# Patient Record
Sex: Female | Born: 1975 | Race: White | Hispanic: No | Marital: Married | Smoking: Never smoker
Health system: Southern US, Community
[De-identification: ages and names within clinical notes are randomized; demographics above are authoritative.]

## PROBLEM LIST (undated history)

## (undated) DIAGNOSIS — I4891 Unspecified atrial fibrillation: Secondary | ICD-10-CM

## (undated) HISTORY — DX: Unspecified atrial fibrillation: I48.91

## (undated) HISTORY — PX: LEEP: SHX91

---

## 1999-09-30 ENCOUNTER — Emergency Department (HOSPITAL_COMMUNITY): Admission: EM | Admit: 1999-09-30 | Discharge: 1999-09-30 | Payer: Self-pay | Admitting: Emergency Medicine

## 1999-09-30 ENCOUNTER — Encounter: Payer: Self-pay | Admitting: Emergency Medicine

## 1999-12-04 ENCOUNTER — Other Ambulatory Visit: Admission: RE | Admit: 1999-12-04 | Discharge: 1999-12-04 | Payer: Self-pay | Admitting: Obstetrics and Gynecology

## 2000-12-28 ENCOUNTER — Other Ambulatory Visit: Admission: RE | Admit: 2000-12-28 | Discharge: 2000-12-28 | Payer: Self-pay | Admitting: Obstetrics and Gynecology

## 2001-05-06 ENCOUNTER — Encounter: Admission: RE | Admit: 2001-05-06 | Discharge: 2001-05-06 | Payer: Self-pay | Admitting: Internal Medicine

## 2001-05-06 ENCOUNTER — Encounter: Payer: Self-pay | Admitting: Internal Medicine

## 2001-05-19 ENCOUNTER — Encounter: Payer: Self-pay | Admitting: Gastroenterology

## 2001-05-19 ENCOUNTER — Emergency Department (HOSPITAL_COMMUNITY): Admission: EM | Admit: 2001-05-19 | Discharge: 2001-05-19 | Payer: Self-pay | Admitting: *Deleted

## 2001-05-20 ENCOUNTER — Encounter: Payer: Self-pay | Admitting: Gastroenterology

## 2001-05-20 ENCOUNTER — Ambulatory Visit (HOSPITAL_COMMUNITY): Admission: RE | Admit: 2001-05-20 | Discharge: 2001-05-20 | Payer: Self-pay | Admitting: Gastroenterology

## 2001-05-27 ENCOUNTER — Ambulatory Visit (HOSPITAL_COMMUNITY): Admission: RE | Admit: 2001-05-27 | Discharge: 2001-05-27 | Payer: Self-pay | Admitting: Gastroenterology

## 2001-12-19 ENCOUNTER — Other Ambulatory Visit: Admission: RE | Admit: 2001-12-19 | Discharge: 2001-12-19 | Payer: Self-pay | Admitting: Obstetrics and Gynecology

## 2002-06-13 ENCOUNTER — Inpatient Hospital Stay (HOSPITAL_COMMUNITY): Admission: AD | Admit: 2002-06-13 | Discharge: 2002-06-16 | Payer: Self-pay | Admitting: Obstetrics and Gynecology

## 2002-07-25 ENCOUNTER — Other Ambulatory Visit: Admission: RE | Admit: 2002-07-25 | Discharge: 2002-07-25 | Payer: Self-pay | Admitting: Obstetrics and Gynecology

## 2002-09-05 ENCOUNTER — Encounter: Admission: RE | Admit: 2002-09-05 | Discharge: 2002-10-05 | Payer: Self-pay | Admitting: Obstetrics and Gynecology

## 2003-01-15 ENCOUNTER — Encounter: Admission: RE | Admit: 2003-01-15 | Discharge: 2003-01-15 | Payer: Self-pay | Admitting: Internal Medicine

## 2003-08-31 ENCOUNTER — Other Ambulatory Visit: Admission: RE | Admit: 2003-08-31 | Discharge: 2003-08-31 | Payer: Self-pay | Admitting: Obstetrics and Gynecology

## 2003-11-20 ENCOUNTER — Other Ambulatory Visit: Admission: RE | Admit: 2003-11-20 | Discharge: 2003-11-20 | Payer: Self-pay | Admitting: Obstetrics and Gynecology

## 2004-05-01 ENCOUNTER — Other Ambulatory Visit: Admission: RE | Admit: 2004-05-01 | Discharge: 2004-05-01 | Payer: Self-pay | Admitting: Obstetrics and Gynecology

## 2004-09-19 ENCOUNTER — Other Ambulatory Visit: Admission: RE | Admit: 2004-09-19 | Discharge: 2004-09-19 | Payer: Self-pay | Admitting: Obstetrics and Gynecology

## 2004-10-06 ENCOUNTER — Encounter: Admission: RE | Admit: 2004-10-06 | Discharge: 2004-10-06 | Payer: Self-pay | Admitting: Obstetrics and Gynecology

## 2005-05-05 ENCOUNTER — Ambulatory Visit (HOSPITAL_COMMUNITY): Admission: RE | Admit: 2005-05-05 | Discharge: 2005-05-05 | Payer: Self-pay | Admitting: Internal Medicine

## 2006-02-16 ENCOUNTER — Emergency Department (HOSPITAL_COMMUNITY): Admission: EM | Admit: 2006-02-16 | Discharge: 2006-02-17 | Payer: Self-pay | Admitting: Emergency Medicine

## 2006-03-01 ENCOUNTER — Encounter: Admission: RE | Admit: 2006-03-01 | Discharge: 2006-03-01 | Payer: Self-pay | Admitting: Optometry

## 2006-03-25 ENCOUNTER — Emergency Department (HOSPITAL_COMMUNITY): Admission: EM | Admit: 2006-03-25 | Discharge: 2006-03-25 | Payer: Self-pay | Admitting: Emergency Medicine

## 2008-07-17 IMAGING — CT CT ORBIT/TEMPORAL/IAC W/O CM
3 of 4 series · 16 of 47 positions shown, 19 images · IV contrast (agent unspecified)
Comparison: 01/15/03.

CLINICAL DATA: History of left eye trauma three months ago.  Left medial orbital and sinus pain.  
 CT ORBIT WITHOUT CONTRAST:
TECHNIQUE: Axial and coronal plane CT imaging was performed through the orbits.  No intravenous contrast was administered.

[Series 3: bone window sinus · axial · 0.33mm/px · z∈[-38,+42]mm · 10 of 38 slices shown, 13 images]
[im 3/38  brain]
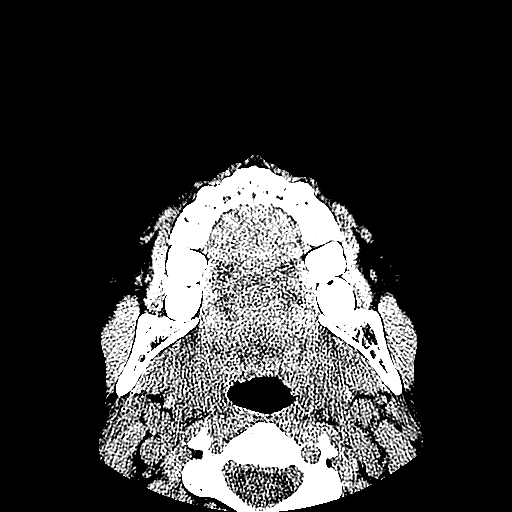
[im 3/38  bone]
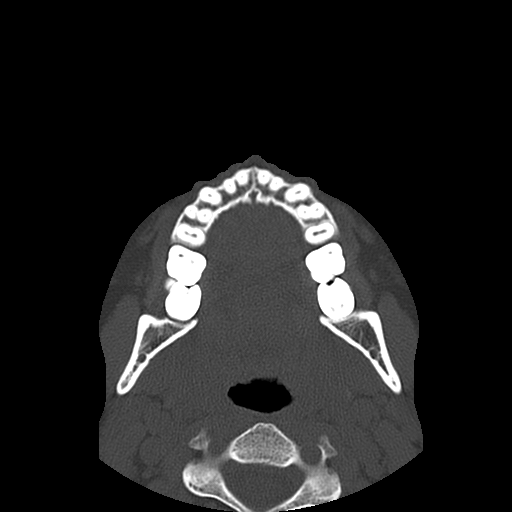
[im 6/38  bone]
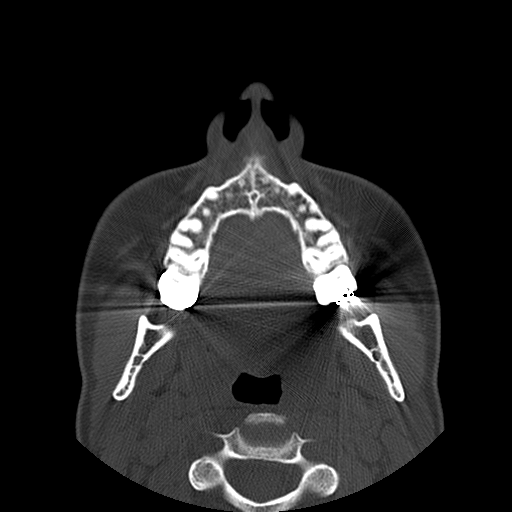
[im 11/38  bone]
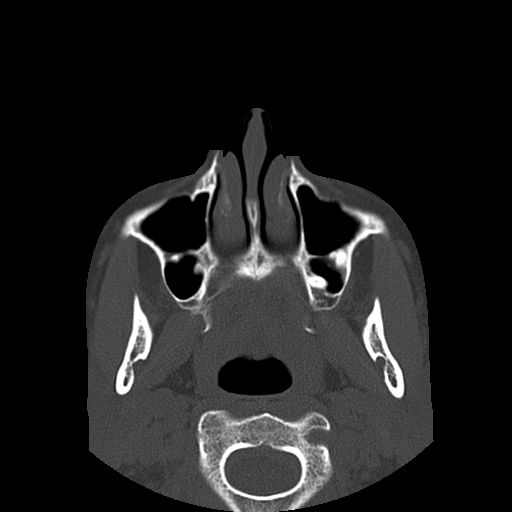
[im 14/38  bone]
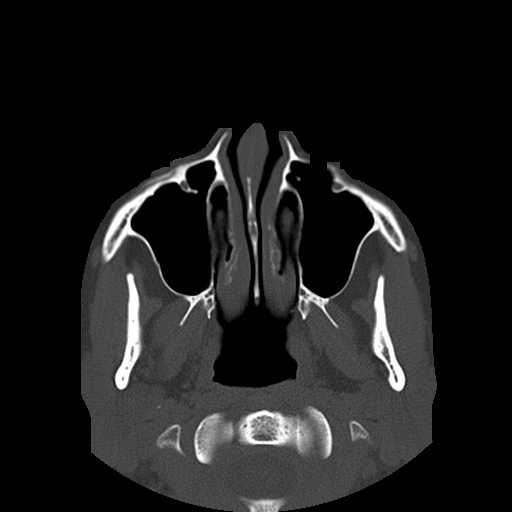
[im 16/38  brain]
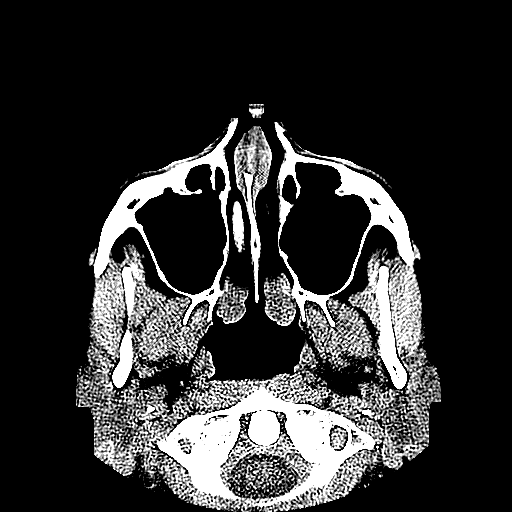
[im 16/38  bone]
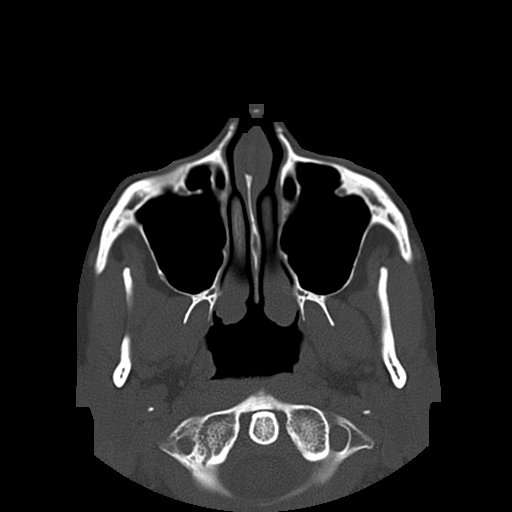
[im 22/38  bone]
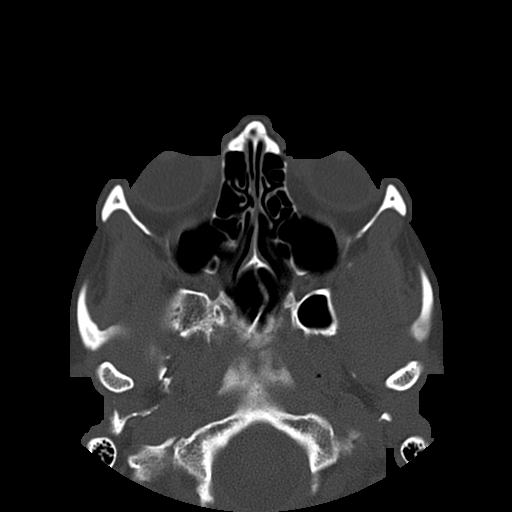
[im 24/38  bone]
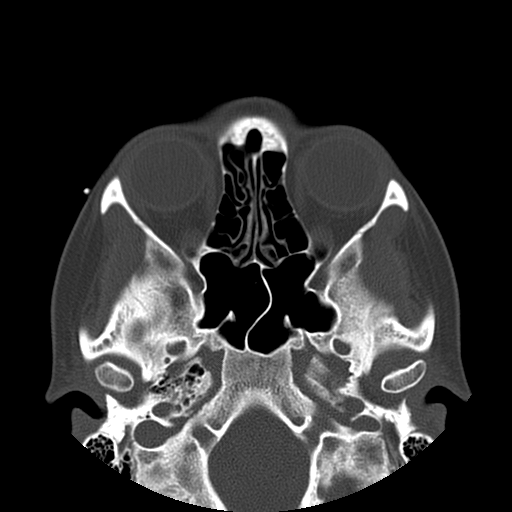
[im 27/38  bone]
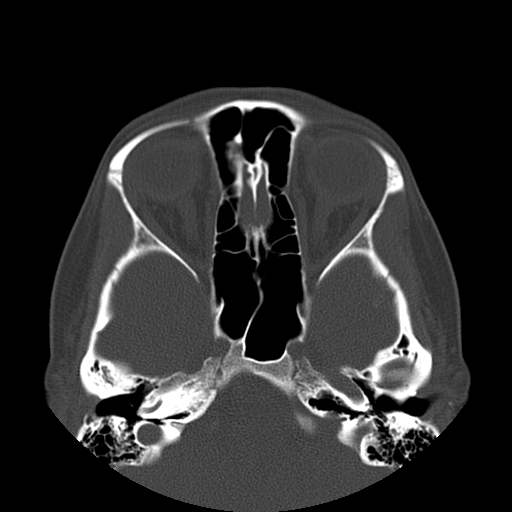
[im 32/38  brain]
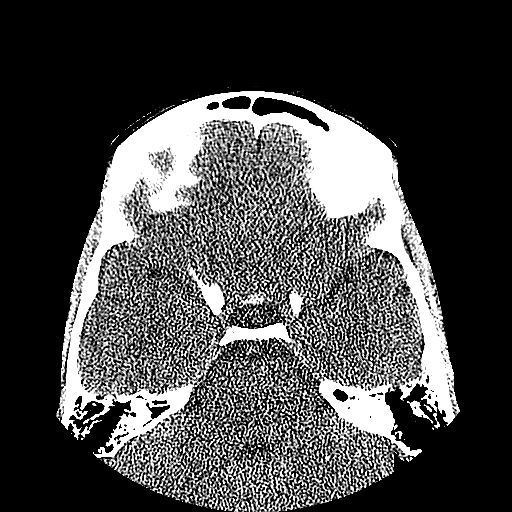
[im 32/38  bone]
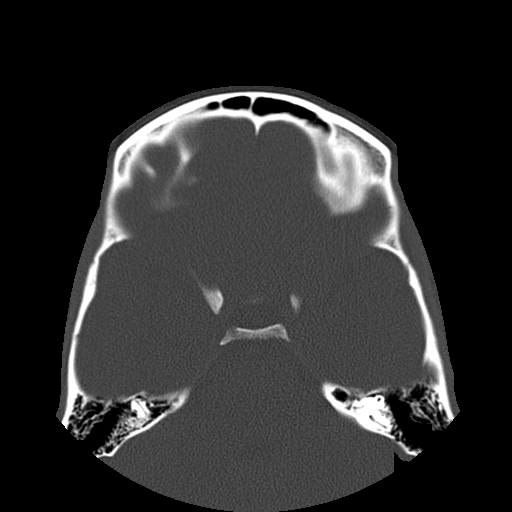
[im 35/38  bone]
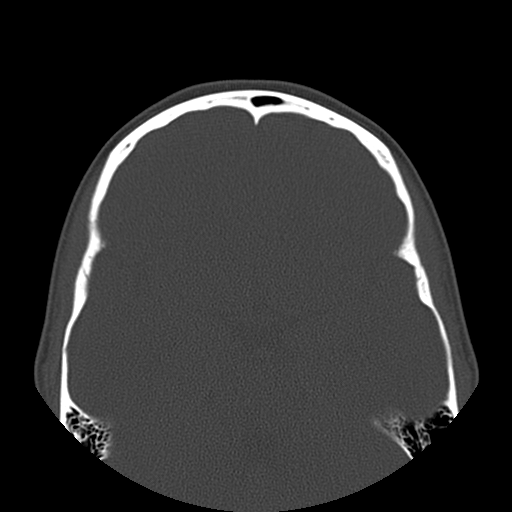

[Series 601: coronal body · coronal · 0.33mm/px · 3 of 49 slices shown]
[im 17/49  bone]
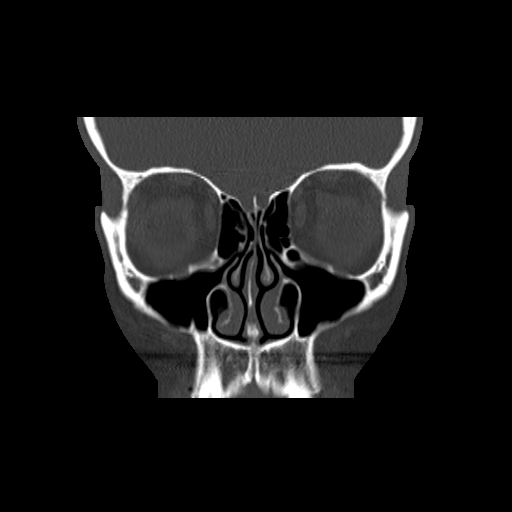
[im 25/49  bone]
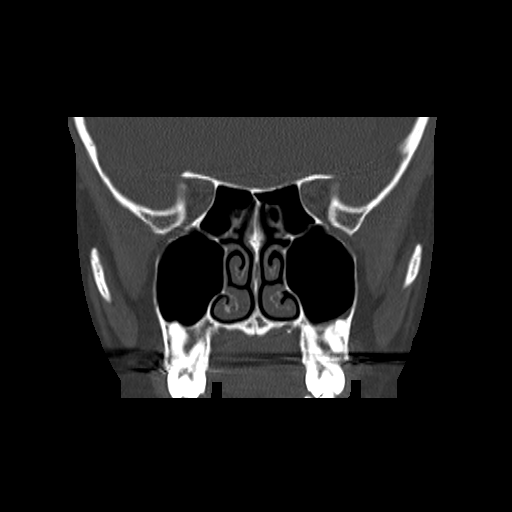
[im 33/49  bone]
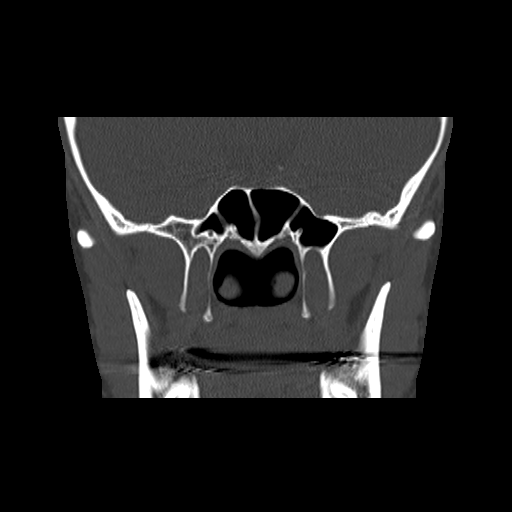

[Series 602: sagittal body · sagittal · 0.33mm/px · 3 of 47 slices shown]
[im 16/47  bone]
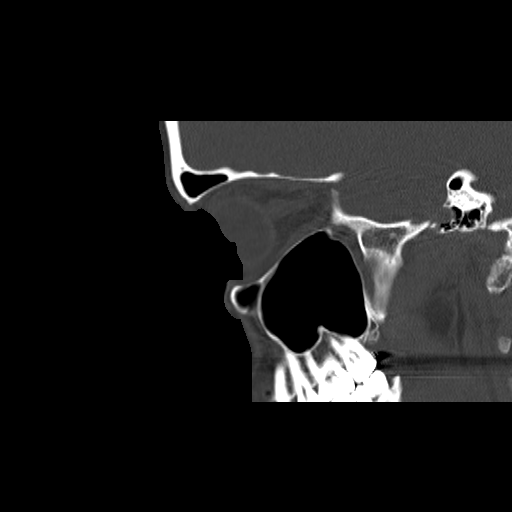
[im 21/47  bone]
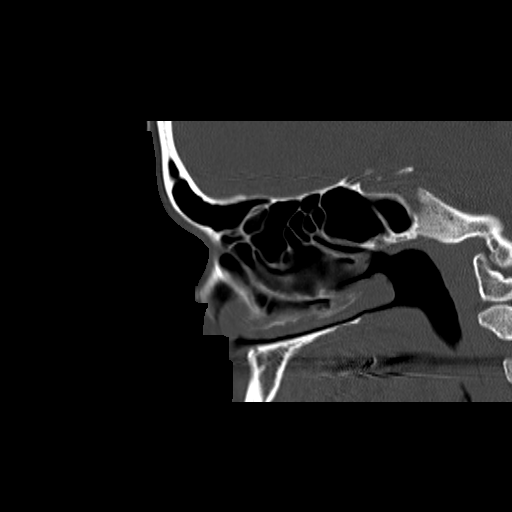
[im 26/47  bone]
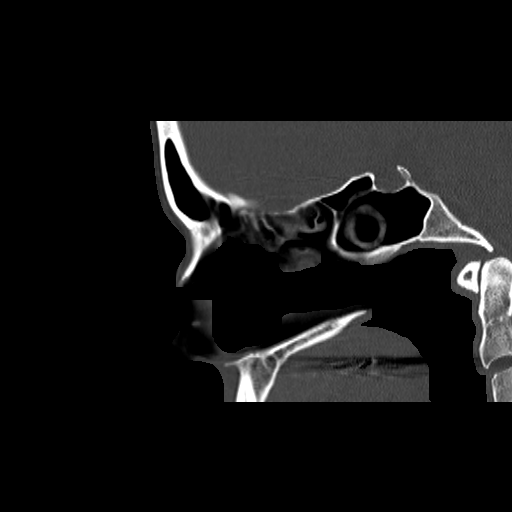

[16 of 47 positions shown; findings below may reference images not displayed]

FINDINGS: Soft tissue windows demonstrate normal appearance of the globes and extraocular muscles.  BB over the right temporal region.  No significant soft tissue swelling.  Limited intracranial imaging is normal.  
 Bone windows demonstrate clear paranasal sinuses.  Clear mastoid air cells.  No fracture.  The orbital walls are intact.  Mandibular condyles are located.  
 Orbital floors are intact.
IMPRESSION: No acute findings.

## 2019-06-09 ENCOUNTER — Encounter: Payer: Self-pay | Admitting: Cardiology

## 2019-06-09 ENCOUNTER — Ambulatory Visit (INDEPENDENT_AMBULATORY_CARE_PROVIDER_SITE_OTHER): Payer: Medicaid Other | Admitting: Cardiology

## 2019-06-09 ENCOUNTER — Other Ambulatory Visit: Payer: Self-pay

## 2019-06-09 VITALS — BP 102/64 | HR 80 | Temp 98.2°F | Ht 66.5 in | Wt 120.6 lb

## 2019-06-09 DIAGNOSIS — R0609 Other forms of dyspnea: Secondary | ICD-10-CM

## 2019-06-09 DIAGNOSIS — R002 Palpitations: Secondary | ICD-10-CM

## 2019-06-09 DIAGNOSIS — R0600 Dyspnea, unspecified: Secondary | ICD-10-CM | POA: Diagnosis not present

## 2019-06-09 DIAGNOSIS — I480 Paroxysmal atrial fibrillation: Secondary | ICD-10-CM

## 2019-06-09 DIAGNOSIS — R5383 Other fatigue: Secondary | ICD-10-CM

## 2019-06-09 HISTORY — DX: Paroxysmal atrial fibrillation: I48.0

## 2019-06-09 HISTORY — DX: Other forms of dyspnea: R06.09

## 2019-06-09 HISTORY — DX: Other fatigue: R53.83

## 2019-06-09 HISTORY — DX: Dyspnea, unspecified: R06.00

## 2019-06-09 HISTORY — DX: Palpitations: R00.2

## 2019-06-09 NOTE — Progress Notes (Signed)
Cardiology Consultation:    Date:  06/09/2019   ID:  Anita Rasmussen, DOB [birth year], MRN [id number]  PCP:  Arnetha Gula, MD  Cardiologist:  Jenne Campus, MD   Referring MD: Arnetha Gula, MD   No chief complaint on file. Palpitations  History of Present Illness:    Anita Rasmussen is a 44 y.o. female who is being seen today for the evaluation of palpitations at the request of Arnetha Gula, MD. About 10 years ago she was given some Levaquin, she did have some allergic reaction to it didn't feel well and since that time all problem started. The reason why she is in the office is the fact that she does have palpitations. She can feel her heart skipping a beat that happened on 2 different situations. Usually when she is resting. She also complained of having some more sustained arrhythmias when she can feel her heart speeding up. Usually gradual onset gradual offset. She is able to slow her heart down by calming herself down. I do have her referring physician notes that tells that she does have history of paroxysmal atrial fibrillation, however, I have no documentation for it. She cannot tell me if her heart is regular or irregular when she has palpitations. She also complained of having a lot of additional problems that include profound weakness fatigue as well as shortness of breath. She used to exercise on the regular basis she said she used to lift some weights. However now she cannot do it because of profound weakness and fatigue. She was told to be allergic to multiple foods, and she is very special diet to try to limit this problem. He does not have a primary care physician also seems on documentation of her borderline diabetes as well as some vitamin D deficiency. She does not exercise on a regular basis Does not have hypertension She is not sure what her cholesterol is.   Past Medical History:  Diagnosis Date  . Atrial fibrillation [facility])     Past Surgical  History:  Procedure Laterality Date  . LEEP      Current Medications: No outpatient medications have been marked as taking for the 06/09/19 encounter (Office Visit) with Park Liter, MD.     Allergies:   Quinolones   Social History   Socioeconomic History  . Marital status: Married    Spouse name: Not on file  . Number of children: Not on file  . Years of education: Not on file  . Highest education level: Not on file  Occupational History  . Not on file  Tobacco Use  . Smoking status: Never Smoker  . Smokeless tobacco: Never Used  Substance and Sexual Activity  . Alcohol use: Not Currently  . Drug use: Not on file  . Sexual activity: Not on file  Other Topics Concern  . Not on file  Social History Narrative  . Not on file   Social Determinants of Health   Financial Resource Strain:   . Difficulty of Paying Living Expenses:   Food Insecurity:   . Worried About Charity fundraiser in the Last Year:   . Arboriculturist in the Last Year:   Transportation Needs:   . Film/video editor (Medical):   Marland Kitchen Lack of Transportation (Non-Medical):   Physical Activity:   . Days of Exercise per Week:   . Minutes of Exercise per Session:   Stress:   . Feeling of Stress :  Social Connections:   . Frequency of Communication with Friends and Family:   . Frequency of Social Gatherings with Friends and Family:   . Attends Religious Services:   . Active Member of Clubs or Organizations:   . Attends Banker Meetings:   Marland Kitchen Marital Status:      Family History: The patient's family history includes Cancer in her maternal grandmother; Diabetes in her paternal grandmother; Heart attack in her maternal grandfather and paternal grandfather; Scoliosis in her father. ROS:   Please see the history of present illness.    All 14 point review of systems negative except as described per history of present illness.  EKGs/Labs/Other Studies Reviewed:    The following  studies were reviewed today:   EKG:  EKG is  ordered today.  The ekg ordered today demonstrates normal sinus rhythm, normal P interval, normal QS complex duration pathology, no ST segment changes.  Recent Labs: No results found for requested labs within last 8760 hours.  Recent Lipid Panel No results found for: CHOL, TRIG, HDL, CHOLHDL, VLDL, LDLCALC, LDLDIRECT  Physical Exam:    VS:  BP 102/64   Pulse 80   Temp 98.2 F (36.8 C)   Ht 5' 6.5" (1.689 m)   Wt 120 lb 9.6 oz (54.7 kg)   SpO2 97%   BMI 19.17 kg/m     Wt Readings from Last 3 Encounters:  06/09/19 120 lb 9.6 oz (54.7 kg)     GEN:  Well nourished, well developed in no acute distress HEENT: Normal NECK: No JVD; No carotid bruits LYMPHATICS: No lymphadenopathy CARDIAC: RRR, no murmurs, no rubs, no gallops RESPIRATORY:  Clear to auscultation without rales, wheezing or rhonchi  ABDOMEN: Soft, non-tender, non-distended MUSCULOSKELETAL:  No edema; No deformity  SKIN: Warm and dry NEUROLOGIC:  Alert and oriented x 3 PSYCHIATRIC:  Normal affect   ASSESSMENT:    1. Dyspnea on exertion   2. Palpitations   3. Fatigue, unspecified type   4. Paroxysmal atrial fibrillation (HCC)    PLAN:    In order of problems listed above:  1. Palpitations with some diagnosis of paroxysmal atrial fibrillation. We talked in length about this condition. From the description she gave me she clearly got some extrasystole, however concern is more sustained arrhythmia. I offered her Zio patch. She told me that she does not have any insurance and she is looking for an alternative to it. Therefore, we talk about potentially using Cardia or Apple watch.  She is willing to try that.  However because of her palpitation as well as dyspnea on exertion and potential history of proximal atrial fibrillation I will ask her to have an echocardiogram.  She agreed to have it. 2. Questionable history of paroxysmal atrial fibrillation.  I have no  documentation for it I see some documentation of some palpitations that being coming down with some drinking of water usually this is not an elevated atrial fibrillation behavior.  Again we will try to get some monitor to determine exactly what kind of arrhythmia with dealing with. 3. Multiple additional problems.  Followed by internal medicine team.  I think the key for her will be to start being a little more active and hopefully with monitor showing no significant arrhythmia and echocardiogram showing preserved ejection fraction will be able to do that.     Medication Adjustments/Labs and Tests Ordered: Current medicines are reviewed at length with the patient today.  Concerns regarding medicines are outlined above.  Orders Placed This Encounter  Procedures  . EKG 12-Lead  . ECHOCARDIOGRAM COMPLETE   No orders of the defined types were placed in this encounter.   Signed, Georgeanna Lea, MD, [facility]. 06/09/2019 12:14 PM    [facility]

## 2019-06-09 NOTE — Patient Instructions (Signed)
Medication Instructions:  Your physician recommends that you continue on your current medications as directed. Please refer to the Current Medication list given to you today.  *If you need a refill on your cardiac medications before your next appointment, please call your pharmacy*   Lab Work: None If you have labs (blood work) drawn today and your tests are completely normal, you will receive your results only by: . MyChart Message (if you have MyChart) OR . A paper copy in the mail If you have any lab test that is abnormal or we need to change your treatment, we will call you to review the results.   Testing/Procedures: Your physician has requested that you have an echocardiogram. Echocardiography is a painless test that uses sound waves to create images of your heart. It provides your doctor with information about the size and shape of your heart and how well your heart's chambers and valves are working. This procedure takes approximately one hour. There are no restrictions for this procedure.     Follow-Up: At [facility], you and your health needs are our priority.  As part of our continuing mission to provide you with exceptional heart care, we have created designated Provider Care Teams.  These Care Teams include your primary Cardiologist (physician) and Advanced Practice Providers (APPs -  Physician Assistants and Nurse Practitioners) who all work together to provide you with the care you need, when you need it.  We recommend signing up for the patient portal called "MyChart".  Sign up information is provided on this After Visit Summary.  MyChart is used to connect with patients for Virtual Visits (Telemedicine).  Patients are able to view lab/test results, encounter notes, upcoming appointments, etc.  Non-urgent messages can be sent to your provider as well.   To learn more about what you can do with MyChart, go to [email or web address].    Your next appointment:   6  week(s)  The format for your next appointment:   In Person  Provider:   Robert Krasowski, MD   Other Instructions   

## 2019-06-22 ENCOUNTER — Ambulatory Visit (INDEPENDENT_AMBULATORY_CARE_PROVIDER_SITE_OTHER): Payer: Medicaid Other

## 2019-06-22 ENCOUNTER — Other Ambulatory Visit: Payer: Self-pay

## 2019-06-22 DIAGNOSIS — R002 Palpitations: Secondary | ICD-10-CM | POA: Diagnosis not present

## 2019-06-22 DIAGNOSIS — R0600 Dyspnea, unspecified: Secondary | ICD-10-CM

## 2019-06-22 NOTE — Progress Notes (Signed)
Complete echocardiogram performed.  Jimmy Clora Ohmer RDCS, RVT  

## 2019-06-26 ENCOUNTER — Telehealth: Payer: Self-pay

## 2019-06-26 NOTE — Telephone Encounter (Signed)
-----   Message from Georgeanna Lea, MD sent at 06/25/2019  6:50 PM EDT ----- Echocardiogram showed normal left ventricle ejection fraction, overall looks good

## 2019-06-26 NOTE — Telephone Encounter (Signed)
Spoke with patient regarding results.  Patient verbalizes understanding and is agreeable to plan of care. Advised patient to call back with any issues or concerns.  

## 2019-07-05 ENCOUNTER — Other Ambulatory Visit: Payer: Self-pay

## 2019-07-05 ENCOUNTER — Ambulatory Visit (INDEPENDENT_AMBULATORY_CARE_PROVIDER_SITE_OTHER): Payer: Medicaid Other | Admitting: Cardiology

## 2019-07-05 ENCOUNTER — Encounter: Payer: Self-pay | Admitting: Cardiology

## 2019-07-05 ENCOUNTER — Ambulatory Visit (INDEPENDENT_AMBULATORY_CARE_PROVIDER_SITE_OTHER): Payer: Medicaid Other

## 2019-07-05 VITALS — BP 94/62 | HR 71 | Ht 66.5 in | Wt 120.0 lb

## 2019-07-05 DIAGNOSIS — R5383 Other fatigue: Secondary | ICD-10-CM | POA: Diagnosis not present

## 2019-07-05 DIAGNOSIS — R002 Palpitations: Secondary | ICD-10-CM

## 2019-07-05 DIAGNOSIS — I480 Paroxysmal atrial fibrillation: Secondary | ICD-10-CM

## 2019-07-05 NOTE — Progress Notes (Signed)
Cardiology Office Note:    Date:  07/05/2019   ID:  Anita Rasmussen, DOB [birth year], MRN [id number]  PCP:  Arnetha Gula, MD  Cardiologist:  Berniece Salines, DO  Electrophysiologist:  None   Referring MD: Arnetha Gula, MD   Chief Complaint  Patient presents with  . Establish Care    Follow up echo   History of Present Illness:    Anita Rasmussen is a 44 y.o. female with a hx of paroxysmal atrial fibrillation which was diagnosed about 12 years ago, she has been told that she has autoimmune disease unclear which, vitamin D deficiency,. She states that this all started when was given Levaquin.  At that time she was advised to be started on medication but she was told that it would be at a minimal dose therefore she did not want to be started on this medication.  Tells me that she does have intermittent palpitations. She reports that she has had medications recently for diagnosed EBV which made her palpitations worse.  But since stopping the medication this has resolved.  Of note she did see my partner on June 09, 2019 at that time an echocardiogram was done the results were previously called to the patient which was noted to be normal.  Past Medical History:  Diagnosis Date  . Atrial fibrillation [facility])     Past Surgical History:  Procedure Laterality Date  . LEEP      Current Medications: No outpatient medications have been marked as taking for the 07/05/19 encounter (Office Visit) with Berniece Salines, DO.     Allergies:   Quinolones   Social History   Socioeconomic History  . Marital status: Married    Spouse name: Not on file  . Number of children: Not on file  . Years of education: Not on file  . Highest education level: Not on file  Occupational History  . Not on file  Tobacco Use  . Smoking status: Never Smoker  . Smokeless tobacco: Never Used  Substance and Sexual Activity  . Alcohol use: Not Currently  . Drug use: Not on file  . Sexual activity: Not on  file  Other Topics Concern  . Not on file  Social History Narrative  . Not on file   Social Determinants of Health   Financial Resource Strain:   . Difficulty of Paying Living Expenses:   Food Insecurity:   . Worried About Charity fundraiser in the Last Year:   . Arboriculturist in the Last Year:   Transportation Needs:   . Film/video editor (Medical):   Marland Kitchen Lack of Transportation (Non-Medical):   Physical Activity:   . Days of Exercise per Week:   . Minutes of Exercise per Session:   Stress:   . Feeling of Stress :   Social Connections:   . Frequency of Communication with Friends and Family:   . Frequency of Social Gatherings with Friends and Family:   . Attends Religious Services:   . Active Member of Clubs or Organizations:   . Attends Archivist Meetings:   Marland Kitchen Marital Status:      Family History: The patient's family history includes Cancer in her maternal grandmother; Diabetes in her paternal grandmother; Heart attack in her maternal grandfather and paternal grandfather; Scoliosis in her father.  ROS:   Review of Systems  Constitution: Negative for decreased appetite, fever and weight gain.  HENT: Negative for congestion, ear discharge, hoarse voice  and sore throat.   Eyes: Negative for discharge, redness, vision loss in right eye and visual halos.  Cardiovascular: Negative for chest pain, dyspnea on exertion, leg swelling, orthopnea and palpitations.  Respiratory: Negative for cough, hemoptysis, shortness of breath and snoring.   Endocrine: Negative for heat intolerance and polyphagia.  Hematologic/Lymphatic: Negative for bleeding problem. Does not bruise/bleed easily.  Skin: Negative for flushing, nail changes, rash and suspicious lesions.  Musculoskeletal: Negative for arthritis, joint pain, muscle cramps, myalgias, neck pain and stiffness.  Gastrointestinal: Negative for abdominal pain, bowel incontinence, diarrhea and excessive appetite.   Genitourinary: Negative for decreased libido, genital sores and incomplete emptying.  Neurological: Negative for brief paralysis, focal weakness, headaches and loss of balance.  Psychiatric/Behavioral: Negative for altered mental status, depression and suicidal ideas.  Allergic/Immunologic: Negative for HIV exposure and persistent infections.    EKGs/Labs/Other Studies Reviewed:    The following studies were reviewed today:   EKG:  The ekg ordered today demonstrates    TTE IMPRESSIONS 4/222021 1. Left ventricular ejection fraction, by estimation, is 60 to 65%. The left ventricle has normal function. The left ventricle has no regional wall motion abnormalities. Left ventricular diastolic parameters are consistent with Grade I diastolic dysfunction (impaired relaxation).  2. Right ventricular systolic function is normal. The right ventricular size is normal. There is normal pulmonary artery systolic pressure. 3. The mitral valve is normal in structure. No evidence of mitral valve regurgitation. No evidence of mitral stenosis.  4. The aortic valve is normal in structure. Aortic valve regurgitation is not visualized. No aortic stenosis is present.  5. The inferior vena cava is normal in size with greater than 50% respiratory variability, suggesting right atrial pressure of 3 mmHg.   Recent Labs: No results found for requested labs within last 8760 hours.  Recent Lipid Panel No results found for: CHOL, TRIG, HDL, CHOLHDL, VLDL, LDLCALC, LDLDIRECT  Physical Exam:    VS:  BP 94/62 (BP Location: Left Arm, Patient Position: Sitting, Cuff Size: Normal)   Pulse 71   Ht 5' 6.5" (1.689 m)   Wt 120 lb (54.4 kg)   SpO2 95%   BMI 19.08 kg/m     Wt Readings from Last 3 Encounters:  07/05/19 120 lb (54.4 kg)  06/09/19 120 lb 9.6 oz (54.7 kg)     GEN: Well nourished, well developed in no acute distress HEENT: Normal NECK: No JVD; No carotid bruits LYMPHATICS: No  lymphadenopathy CARDIAC: S1S2 noted,RRR, no murmurs, rubs, gallops RESPIRATORY:  Clear to auscultation without rales, wheezing or rhonchi  ABDOMEN: Soft, non-tender, non-distended, +bowel sounds, no guarding. EXTREMITIES: No edema, No cyanosis, no clubbing MUSCULOSKELETAL:  No deformity  SKIN: Warm and dry NEUROLOGIC:  Alert and oriented x 3, non-focal PSYCHIATRIC:  Normal affect, good insight  ASSESSMENT:    1. PAF (paroxysmal atrial fibrillation) (HCC)   2. Palpitations   3. Fatigue, unspecified type    PLAN:     Today in the office she is willing to try the monitor.  I have educated the patient how to use this device.  I am hoping will be able to get more information that she wears her ZIO monitor.  Her echo result again was discussed with me in the office.  She had no questions at this time.   The patient is in agreement with the above plan. The patient left the office in stable condition.  The patient will follow up in 3 months or sooner if needed.   Medication  Adjustments/Labs and Tests Ordered: Current medicines are reviewed at length with the patient today.  Concerns regarding medicines are outlined above.  Orders Placed This Encounter  Procedures  . LONG TERM MONITOR (3-14 DAYS)   No orders of the defined types were placed in this encounter.   Patient Instructions  Medication Instructions:  Your physician recommends that you continue on your current medications as directed. Please refer to the Current Medication list given to you today.   *If you need a refill on your cardiac medications before your next appointment, please call your pharmacy*   Lab Work: None ordered   If you have labs (blood work) drawn today and your tests are completely normal, you will receive your results only by: Marland Kitchen MyChart Message (if you have MyChart) OR . A paper copy in the mail If you have any lab test that is abnormal or we need to change your treatment, we will call you to  review the results.   Testing/Procedures: A zio monitor was ordered today. It will remain on for 14 days. You will then return monitor and event diary in provided box. It takes 1-2 weeks for report to be downloaded and returned to [place]. We will call you with the results. If monitor falls off or has orange flashing light, please call Zio for further instructions.      Follow-Up: At [facility], you and your health needs are our priority.  As part of our continuing mission to provide you with exceptional heart care, we have created designated Provider Care Teams.  These Care Teams include your primary Cardiologist (physician) and Advanced Practice Providers (APPs -  Physician Assistants and Nurse Practitioners) who all work together to provide you with the care you need, when you need it.  We recommend signing up for the patient portal called "MyChart".  Sign up information is provided on this After Visit Summary.  MyChart is used to connect with patients for Virtual Visits (Telemedicine).  Patients are able to view lab/test results, encounter notes, upcoming appointments, etc.  Non-urgent messages can be sent to your provider as well.   To learn more about what you can do with MyChart, go to [email or web address].    Your next appointment:   3 month(s)  The format for your next appointment:   In Person  Provider:   Thomasene Ripple, DO   Other Instructions None      Adopting a Healthy Lifestyle.  Know what a healthy weight is for you (roughly BMI <25) and aim to maintain this   Aim for 7+ servings of fruits and vegetables daily   65-80+ fluid ounces of water or unsweet tea for healthy kidneys   Limit to max 1 drink of alcohol per day; avoid smoking/tobacco   Limit animal fats in diet for cholesterol and heart health - choose grass fed whenever available   Avoid highly processed foods, and foods high in saturated/trans fats   Aim for low stress - take time to unwind and care  for your mental health   Aim for 150 min of moderate intensity exercise weekly for heart health, and weights twice weekly for bone health   Aim for 7-9 hours of sleep daily   When it comes to diets, agreement about the perfect plan isnt easy to find, even among the experts. Experts at the [facility] [employer] developed an idea known as the Healthy Eating Plate. Just imagine a plate divided into logical, healthy portions.   The  emphasis is on diet quality:   Load up on vegetables and fruits - one-half of your plate: Aim for color and variety, and remember that potatoes dont count.   Go for whole grains - one-quarter of your plate: Whole wheat, barley, wheat berries, quinoa, oats, brown rice, and foods made with them. If you want pasta, go with whole wheat pasta.   Protein power - one-quarter of your plate: Fish, chicken, beans, and nuts are all healthy, versatile protein sources. Limit red meat.   The diet, however, does go beyond the plate, offering a few other suggestions.   Use healthy plant oils, such as olive, canola, soy, corn, sunflower and peanut. Check the labels, and avoid partially hydrogenated oil, which have unhealthy trans fats.   If youre thirsty, drink water. Coffee and tea are good in moderation, but skip sugary drinks and limit milk and dairy products to one or two daily servings.   The type of carbohydrate in the diet is more important than the amount. Some sources of carbohydrates, such as vegetables, fruits, whole grains, and beans-are healthier than others.   Finally, stay active  Signed, Thomasene Ripple, DO  07/05/2019 1:50 PM    [facility]

## 2019-07-05 NOTE — Patient Instructions (Signed)
Medication Instructions:  Your physician recommends that you continue on your current medications as directed. Please refer to the Current Medication list given to you today.  *If you need a refill on your cardiac medications before your next appointment, please call your pharmacy*   Lab Work: None ordered   If you have labs (blood work) drawn today and your tests are completely normal, you will receive your results only by: . MyChart Message (if you have MyChart) OR . A paper copy in the mail If you have any lab test that is abnormal or we need to change your treatment, we will call you to review the results.   Testing/Procedures: A zio monitor was ordered today. It will remain on for 14 days. You will then return monitor and event diary in provided box. It takes 1-2 weeks for report to be downloaded and returned to us. We will call you with the results. If monitor falls off or has orange flashing light, please call Zio for further instructions.     Follow-Up: At [facility], you and your health needs are our priority.  As part of our continuing mission to provide you with exceptional heart care, we have created designated Provider Care Teams.  These Care Teams include your primary Cardiologist (physician) and Advanced Practice Providers (APPs -  Physician Assistants and Nurse Practitioners) who all work together to provide you with the care you need, when you need it.  We recommend signing up for the patient portal called "MyChart".  Sign up information is provided on this After Visit Summary.  MyChart is used to connect with patients for Virtual Visits (Telemedicine).  Patients are able to view lab/test results, encounter notes, upcoming appointments, etc.  Non-urgent messages can be sent to your provider as well.   To learn more about what you can do with MyChart, go to [email or web address].    Your next appointment:   3 month(s)  The format for your next appointment:   In  Person  Provider:   Kardie Tobb, DO   Other Instructions None   

## 2019-07-14 ENCOUNTER — Telehealth: Payer: Self-pay | Admitting: Cardiology

## 2019-07-14 NOTE — Telephone Encounter (Signed)
Pt c/o medication issue:  1. Name of Medication: desmopressin acetate 0.1mg    2. How are you currently taking this medication (dosage and times per day)? Has not started yet  3. Are you having a reaction (difficulty breathing--STAT)? no  4. What is your medication issue? Patient states her PCP prescribed the medication and it states if you have heart issues to check with your doctor. 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 She states she is also currently wearing a heart monitor.

## 2019-07-14 NOTE — Telephone Encounter (Signed)
Yes that will be fine she can go ahead and start that medication.

## 2019-07-14 NOTE — Telephone Encounter (Signed)
Spoke to patient and let her know that Dr. Servando Salina said it would be fine for her to take this medication. She verbalizes understanding and no other issues or concerns were noted.    Encouraged patient to call back with any questions or concerns.

## 2019-08-01 ENCOUNTER — Ambulatory Visit: Payer: Self-pay | Admitting: Family Medicine

## 2019-08-02 ENCOUNTER — Ambulatory Visit: Payer: Self-pay | Admitting: Cardiology

## 2019-08-08 ENCOUNTER — Ambulatory Visit: Payer: Medicaid Other | Admitting: Internal Medicine

## 2019-08-08 ENCOUNTER — Other Ambulatory Visit: Payer: Self-pay

## 2019-08-08 ENCOUNTER — Encounter: Payer: Self-pay | Admitting: Internal Medicine

## 2019-08-08 VITALS — BP 116/64 | HR 76 | Temp 98.0°F | Ht 67.0 in | Wt 122.2 lb

## 2019-08-08 DIAGNOSIS — R631 Polydipsia: Secondary | ICD-10-CM | POA: Diagnosis not present

## 2019-08-08 DIAGNOSIS — E063 Autoimmune thyroiditis: Secondary | ICD-10-CM

## 2019-08-08 DIAGNOSIS — R7303 Prediabetes: Secondary | ICD-10-CM | POA: Diagnosis not present

## 2019-08-08 DIAGNOSIS — E28319 Asymptomatic premature menopause: Secondary | ICD-10-CM | POA: Diagnosis not present

## 2019-08-08 NOTE — Patient Instructions (Signed)
-   I suggest establishing care with gynecology to address the previous history of abnormal pap smear  - Please stop by the lab today

## 2019-08-08 NOTE — Progress Notes (Signed)
Name: Anita Rasmussen  MRN/ DOB: [id number], 31-Dec-1975    Age/ Sex: 44 y.o., female    PCP: Jonnie Kind, MD   Reason for Endocrinology Evaluation: Hashimoto's Disease     Date of Initial Endocrinology Evaluation: 08/08/2019     HPI: Ms. Anita Rasmussen is a 44 y.o. female with unremarkable past medical history The patient presented for initial endocrinology clinic visit on 08/08/2019 for consultative assistance with her Hashimoto's disease.     Started seeing integrative health ~ 4 yrs ago, was diagnosed with leaky gut and food sensitivities. Was put on restricted diet which help initially but lately has not been feeling well again.    She was subsequently diagnosed with EBV and was treated with natural products  She is also admitting to drinking excessive amount of water , denies thirst per say but describes that ones she starts, she can't stop, drinking water, Pedialyte and coconut water and does not feel refreshed. If she doesn't drink water she feels dehydrated , weak , shaky with palpitations. She actually sets an alarm at night to wake her up so she can drink water and would go to urinate. Unclear if she has nocturia at night if alarm is not on.  She was advised by integrative health to start desmopressin ( natural) but has not started that yet .    She has diagnosed with Hashimoto's disease, based on elevated Anti-TPO Ab levels 60.66 IU/mL .    Pt had menopause at age 46 . Menarche at 44 yrs old. Periods were regular until age 66.   Took Levaquin at age 13 and was in the ED for 3 hrs and ever since her life has been changed.   She has hot flashes   S/P LEEP for abnormal pap smears, does not recall last pap .    Cousin with thyroid disease , another cousin with Lupus Grand mother with DM   She is on Vitamin D but not calcium     HISTORY:  Past Medical History:  Past Medical History:  Diagnosis Date  . Atrial fibrillation [facility])    Past Surgical  History:  Past Surgical History:  Procedure Laterality Date  . LEEP        Social History:  reports that she has never smoked. She has never used smokeless tobacco. She reports previous alcohol use.  Family History: family history includes Cancer in her maternal grandmother; Diabetes in her paternal grandmother; Heart attack in her maternal grandfather and paternal grandfather; Scoliosis in her father.   HOME MEDICATIONS: Allergies as of 08/08/2019      Reactions   Quinolones       Medication List       Accurate as of August 08, 2019  3:26 PM. If you have any questions, ask your nurse or doctor.        MAGNESIUM PO Take by mouth.   SELENIUM PO Take by mouth.   VITAMIN D PO Take by mouth.   VITAMIN K PO Take by mouth.         REVIEW OF SYSTEMS: A comprehensive ROS was conducted with the patient and is negative except as per HPI and below:  ROS     OBJECTIVE:  VS: BP 116/64 (BP Location: Right Arm, Patient Position: Sitting, Cuff Size: Normal)   Pulse 76   Temp 98 F (36.7 C)   Ht 5\' 7"  (1.702 m)   Wt 122 lb 3.2 oz (55.4 kg)  SpO2 98%   BMI 19.14 kg/m    Wt Readings from Last 3 Encounters:  08/08/19 122 lb 3.2 oz (55.4 kg)  07/05/19 120 lb (54.4 kg)  06/09/19 120 lb 9.6 oz (54.7 kg)     EXAM: General: Pt appears well and is in NAD  Eyes: External eye exam normal without stare, lid lag or exophthalmos.  EOM intact.    Neck: General: Supple without adenopathy. Thyroid: Thyroid size normal.  No goiter or nodules appreciated. No thyroid bruit.  Lungs: Clear with good BS bilat with no rales, rhonchi, or wheezes  Heart: Auscultation: RRR.  Abdomen: Normoactive bowel sounds, soft, nontender, without masses or organomegaly palpable  Extremities:  BL LE: No pretibial edema normal ROM and strength.  Skin: Hair: Texture and amount normal with gender appropriate distribution Skin Inspection: No rashes Skin Palpation: Skin temperature, texture, and thickness  normal to palpation  Neuro: Cranial nerves: II - XII grossly intact  Motor: Normal strength throughout DTRs: 2+ and symmetric in UE without delay in relaxation phase  Mental Status: Judgment, insight: Intact Orientation: Oriented to time, place, and person Mood and affect: No depression, anxiety, or agitation     DATA REVIEWED: Results for Anita, Rasmussen (MRN [id number]) as of 08/09/2019 13:53  Ref. Range 08/08/2019 16:07  Sodium Latest Ref Range: 135 - 145 mEq/L 134 (L)  Potassium Latest Ref Range: 3.5 - 5.1 mEq/L 4.6  Chloride Latest Ref Range: 96 - 112 mEq/L 97  CO2 Latest Ref Range: 19 - 32 mEq/L 27  Glucose Latest Ref Range: 70 - 99 mg/dL 98  BUN Latest Ref Range: 6 - 23 mg/dL 24 (H)  Creatinine Latest Ref Range: 0.40 - 1.20 mg/dL 7.82  Calcium Latest Ref Range: 8.4 - 10.5 mg/dL 9.7  Alkaline Phosphatase Latest Ref Range: 39 - 117 U/L 82  Albumin Latest Ref Range: 3.5 - 5.2 g/dL 4.7  AST Latest Ref Range: 0 - 37 U/L 26  ALT Latest Ref Range: 0 - 35 U/L 41 (H)  Total Protein Latest Ref Range: 6.0 - 8.3 g/dL 7.0  Total Bilirubin Latest Ref Range: 0.2 - 1.2 mg/dL 0.4  GFR Latest Ref Range: >60.00 mL/min 100.61  TSH Latest Ref Range: 0.35 - 4.50 uIU/mL 1.71  T4,Free(Direct) Latest Ref Range: 0.60 - 1.60 ng/dL 9.56       04/15/863  BUN/Cr 17/0.69 GFR 107 Na 139 K 4.2  Ca 9.6 TSH 2.04 uIU/mL  Estradiol < 5.0 pg/mL     12/05/2018 Anti-TPO 50.8 IU/mL     05/29/2019 LH 58.7 mIU/mL  Aldo 3.1 mg/dL  ACTH 78.4 Prolactin 8.4 ng/mL  ADH < 0.8 pg/mL    04/24/2015 A1c 5.8%    06/26/2015 A1c 5.9% DHEAS 166.4 ug/dL Cortisol 69.6 ug/dl Estradiol < 5.0 pg/mL    12/30/2015 Testosterone 9 ng/dL   2/95/2841 ACTH 32.4 pg/mL Cortisol 15.9 ug/dL  Prolactin 8.3 ng/mL    ASSESSMENT/PLAN/RECOMMENDATIONS:   1. Hashimoto's Disease:   - Pt is clinically and biochemically euthyroid  - No local neck symptoms  -I explained to the patient that Hashimoto's Disease  is an autoimmune - mediated destruction of the thyroid gland. The usual course of Hashimoto's thyroiditis is the gradual loss of thyroid function. Overt hypothyroidism occurs at a rate of ~ 5% per year.    2. Prediabetes:  - A1c as high at 5.9% in 2017, this has been trending down. Pt has been eating duck fat for weight gain purposes, we discussed avoiding high-fat diets as they  trend to increase insulin resistance, we discussed low carb diet with more fresh fruits and vegetables.   3.Premature Menopause:   - Pt will benefit for HRT to improve quality of life and bone health but given she is not up to date on pap smears, and mammogram especially with hx of cervical metaplasia (S/P LEEP)  and no follow up on this in a while, I suggested she sees Gyn for further follow up, as without this data, the risk outweighs the benefit for HRT at this time.   4.Polydipsia:  - Presentation not classic for DI. Another differential is primary polydipsia - Serum sodium slightly low, this is most likely secondary to excessive water intake, awaiting on  urine and serum osmolality.     F/U in 4 months   Addendum: Attempted to call pt on 04/11/2019 at 1330 with no answer and no voice mail. A letter will be mailed   Signed electronically by: Mack Guise, MD  [facility]  [facility] [address] [place], [place] [address] Phone: [phone] FAX: [phone]   CC: Arnetha Gula, MD [address] [place]  [address] Phone: [phone] Fax: [phone]   Return to Endocrinology clinic as below: Future Appointments  Date Time Provider [place]  10/06/2019  1:00 PM Tobb, Kardie, DO [facility] None

## 2019-08-09 ENCOUNTER — Encounter: Payer: Self-pay | Admitting: Internal Medicine

## 2019-08-09 DIAGNOSIS — E063 Autoimmune thyroiditis: Secondary | ICD-10-CM

## 2019-08-09 DIAGNOSIS — E28319 Asymptomatic premature menopause: Secondary | ICD-10-CM

## 2019-08-09 DIAGNOSIS — R7303 Prediabetes: Secondary | ICD-10-CM | POA: Insufficient documentation

## 2019-08-09 DIAGNOSIS — R631 Polydipsia: Secondary | ICD-10-CM

## 2019-08-09 HISTORY — DX: Prediabetes: R73.03

## 2019-08-09 HISTORY — DX: Polydipsia: R63.1

## 2019-08-09 HISTORY — DX: Autoimmune thyroiditis: E06.3

## 2019-08-09 HISTORY — DX: Asymptomatic premature menopause: E28.319

## 2019-08-09 LAB — COMPREHENSIVE METABOLIC PANEL
ALT: 41 U/L — ABNORMAL HIGH (ref 0–35)
AST: 26 U/L (ref 0–37)
Albumin: 4.7 g/dL (ref 3.5–5.2)
Alkaline Phosphatase: 82 U/L (ref 39–117)
BUN: 24 mg/dL — ABNORMAL HIGH (ref 6–23)
CO2: 27 mEq/L (ref 19–32)
Calcium: 9.7 mg/dL (ref 8.4–10.5)
Chloride: 97 mEq/L (ref 96–112)
Creatinine, Ser: 0.64 mg/dL (ref 0.40–1.20)
GFR: 100.61 mL/min (ref >60.00)
Glucose, Bld: 98 mg/dL (ref 70–99)
Potassium: 4.6 mEq/L (ref 3.5–5.1)
Sodium: 134 mEq/L — ABNORMAL LOW (ref 135–145)
Total Bilirubin: 0.4 mg/dL (ref 0.2–1.2)
Total Protein: 7 g/dL (ref 6.0–8.3)

## 2019-08-09 LAB — TSH: TSH: 1.71 u[IU]/mL (ref 0.35–4.50)

## 2019-08-09 LAB — T4, FREE: Free T4: 0.66 ng/dL (ref 0.60–1.60)

## 2019-08-10 LAB — OSMOLALITY, URINE: Osmolality, Ur: 167 mOsm/kg (ref 50–1200)

## 2019-08-10 LAB — OSMOLALITY: Osmolality: 283 mOsm/kg (ref 278–305)

## 2019-08-15 ENCOUNTER — Ambulatory Visit: Payer: Self-pay | Admitting: Family Medicine

## 2019-10-05 DIAGNOSIS — I4891 Unspecified atrial fibrillation: Secondary | ICD-10-CM | POA: Insufficient documentation

## 2019-10-06 ENCOUNTER — Ambulatory Visit: Payer: Self-pay | Admitting: Cardiology

## 2019-10-27 ENCOUNTER — Ambulatory Visit: Payer: Self-pay | Admitting: Cardiology

## 2019-11-10 ENCOUNTER — Ambulatory Visit: Payer: Medicaid Other | Admitting: Internal Medicine

## 2020-04-03 ENCOUNTER — Ambulatory Visit: Payer: Medicaid Other | Admitting: Family Medicine

## 2020-04-08 ENCOUNTER — Ambulatory Visit: Payer: Medicaid Other | Admitting: Family Medicine

## 2020-04-12 ENCOUNTER — Ambulatory Visit: Payer: Medicaid Other | Admitting: Obstetrics & Gynecology

## 2020-05-17 ENCOUNTER — Ambulatory Visit (INDEPENDENT_AMBULATORY_CARE_PROVIDER_SITE_OTHER): Payer: Medicaid Other | Admitting: Obstetrics & Gynecology

## 2020-05-17 ENCOUNTER — Encounter: Payer: Self-pay | Admitting: Obstetrics & Gynecology

## 2020-05-17 ENCOUNTER — Other Ambulatory Visit: Payer: Self-pay

## 2020-05-17 ENCOUNTER — Other Ambulatory Visit (HOSPITAL_COMMUNITY)
Admission: RE | Admit: 2020-05-17 | Discharge: 2020-05-17 | Disposition: A | Discharge status: Home / Self Care | Payer: Medicaid Other | Source: Ambulatory Visit | Attending: Obstetrics & Gynecology | Admitting: Obstetrics & Gynecology

## 2020-05-17 VITALS — BP 96/67 | HR 81 | Ht 66.5 in | Wt 125.0 lb

## 2020-05-17 DIAGNOSIS — N951 Menopausal and female climacteric states: Secondary | ICD-10-CM | POA: Diagnosis not present

## 2020-05-17 DIAGNOSIS — Z01419 Encounter for gynecological examination (general) (routine) without abnormal findings: Secondary | ICD-10-CM | POA: Insufficient documentation

## 2020-05-17 DIAGNOSIS — Z1231 Encounter for screening mammogram for malignant neoplasm of breast: Secondary | ICD-10-CM

## 2020-05-17 DIAGNOSIS — Z1211 Encounter for screening for malignant neoplasm of colon: Secondary | ICD-10-CM | POA: Diagnosis not present

## 2020-05-17 NOTE — Progress Notes (Signed)
Subjective:     Anita Rasmussen is a 45 y.o. female here for a routine exam. LMP 10 years prev. Current complaints: pt has pain with intercourse on the left side of the introitus. This has been present for about 5 years. She uses coconut oil for lubrication. She has had a variety of sx since menopause that no one can figure out. She was seen at the [facility] and told that she needed ERT but, was also dx'd with leaky gut and told that she should have that managed first. Pt reports insomnia. She was told that her tyroid was abnormal. She has the labs but did not bring them today but, will get them to [place].        Gynecologic History No LMP recorded. Patient is postmenopausal. Contraception: post menopausal status Last Pap: >5 years prev. H/o Cryo and LEEP years ago. Last mammogram: overdue.   Obstetric History OB History  Gravida Para Term Preterm AB Living  1 1   1   1   SAB IAB Ectopic Multiple Live Births          1    # Outcome Date GA Lbr Len/2nd Weight Sex Delivery Anes PTL Lv  1 Preterm 2004 [redacted]w[redacted]d   M Vag-Spont EPI N LIV   The following portions of the patient's history were reviewed and updated as appropriate: allergies, current medications, past family history, past medical history, past social history, past surgical history and problem list.  Review of Systems Pertinent items are noted in HPI.    Objective:  BP 96/67   Pulse 81   Ht 5' 6.5" (1.689 m)   Wt 125 lb (56.7 kg)   BMI 19.87 kg/m   General Appearance:    Alert, cooperative, no distress, appears stated age  Head:    Normocephalic, without obvious abnormality, atraumatic  Eyes:    conjunctiva/corneas clear, EOM's intact, both eyes  Ears:    Normal external ear canals, both ears  Nose:   Nares normal, septum midline, mucosa normal, no drainage    or sinus tenderness  Throat:   Lips, mucosa, and tongue normal; teeth and gums normal  Neck:   Supple, symmetrical, trachea midline, no adenopathy;     thyroid:  no enlargement/tenderness/nodules  Back:     Symmetric, no curvature, ROM normal, no CVA tenderness  Lungs:     respirations unlabored  Chest Wall:    No tenderness or deformity   Heart:    Regular rate and rhythm  Breast Exam:    No tenderness, masses, or nipple abnormality  Abdomen:     Soft, non-tender, bowel sounds active all four quadrants,    no masses, no organomegaly  Genitalia:    Normal female without lesion, discharge or tenderness   At the introitus on the left 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 This may be getting caught with intercourse.   Extremities:   Extremities normal, atraumatic, no cyanosis or edema  Pulses:   2+ and symmetric all extremities  Skin:   Skin color, texture, turgor normal, no rashes or lesions     Assessment:    Healthy female exam.   Colon cancer screening. Pt wants to delay this until her diet is stabilized Breast cancer screen Dyspareunia- reviewed hymenal ring and the changes there. Pt will use [place] jelly or [place] Liquibeads.     Plan:  [name] was seen today for annual exam.  Diagnoses and all orders for this visit:  Well female  exam with routine gynecological exam -     Cytology - PAP( [place])  Colon cancer screening -     Ambulatory referral to Gastroenterology  Encounter for screening mammogram for malignant neoplasm of breast -     MM DIGITAL SCREENING BILATERAL; Future  Menopausal state  Pt wil  Get her labs delivered to this office. If labs not up to date will order labs to be drawn prior to next visit.    F/u in 4 weeks to discuss labs and possible HRT or sooner prn   Ahmari Duerson L. Harraway-Smith, M.D., [place]

## 2020-05-21 LAB — CYTOLOGY - PAP
Comment: NEGATIVE
Diagnosis: NEGATIVE
High risk HPV: NEGATIVE

## 2020-05-30 ENCOUNTER — Encounter: Payer: Self-pay | Admitting: General Practice

## 2020-06-05 ENCOUNTER — Ambulatory Visit (INDEPENDENT_AMBULATORY_CARE_PROVIDER_SITE_OTHER): Payer: Medicaid Other | Admitting: Cardiology

## 2020-06-05 ENCOUNTER — Encounter: Payer: Self-pay | Admitting: Cardiology

## 2020-06-05 ENCOUNTER — Other Ambulatory Visit: Payer: Self-pay

## 2020-06-05 VITALS — BP 100/60 | HR 80 | Ht 66.0 in | Wt 124.6 lb

## 2020-06-05 DIAGNOSIS — R002 Palpitations: Secondary | ICD-10-CM

## 2020-06-05 DIAGNOSIS — R7303 Prediabetes: Secondary | ICD-10-CM

## 2020-06-05 DIAGNOSIS — R0600 Dyspnea, unspecified: Secondary | ICD-10-CM | POA: Diagnosis not present

## 2020-06-05 DIAGNOSIS — R079 Chest pain, unspecified: Secondary | ICD-10-CM | POA: Diagnosis not present

## 2020-06-05 DIAGNOSIS — I493 Ventricular premature depolarization: Secondary | ICD-10-CM

## 2020-06-05 DIAGNOSIS — R0609 Other forms of dyspnea: Secondary | ICD-10-CM

## 2020-06-05 MED ORDER — MIDODRINE HCL 2.5 MG PO TABS: 2.5000 mg | ORAL_TABLET | Freq: Three times a day (TID) | ORAL | 0 refills | Status: AC | PRN

## 2020-06-05 MED ORDER — MIDODRINE HCL 2.5 MG PO TABS: 2.5000 mg | ORAL_TABLET | Freq: Three times a day (TID) | ORAL | 0 refills | Status: DC | PRN

## 2020-06-05 NOTE — Progress Notes (Signed)
Cardiology Office Note:    Date:  06/05/2020   ID:  Anita Rasmussen, DOB 1975-05-21, MRN [id number]  PCP:  Lavada Mesi, MD  Cardiologist:  Thomasene Ripple, DO  Electrophysiologist:  None   Referring MD: Jonnie Kind, MD   I feel my blood pressure has been dropping and have been having some chest pain  History of Present Illness:    Anita Rasmussen is a 45 y.o. female with a hx of questionable atrial fibrillation has not been any documentation of this but as noted in her chart, prediabetes, Hashimoto's disease, is here today to be evaluated for concern for low blood pressure as well as chest discomfort and dyspnea on exertion.  The patient reports that she has been noticing that her blood pressure is usually staying in the low 100s and sometimes dropping and systolics in the 90s.  She has times where she felt dizzy but has not had any significant lightheadedness or any syncope episode.  What my concern is the fact that she experiencing some dyspnea on exertion and intermittent chest discomfort.  She described as intermittent sensation that is painful midsternal feeling.  It last for minutes to seconds prior to resolution.  She also does have shortness of breath associated with this.  And at times she really does not.  Sometimes she feels short of breath walking up a flight of stairs that she normally would do about a year ago without any problem.  She also tells me that she has had some increasing palpitations even at rest.  But had not had any dizziness or syncope episode.     Past Medical History:  Diagnosis Date  . Atrial fibrillation (HCC)   . Dyspnea on exertion 06/09/2019  . Fatigue 06/09/2019  . Hashimoto's disease 08/09/2019  . Palpitations 06/09/2019  . Paroxysmal atrial fibrillation (HCC) 06/09/2019  . Polydipsia 08/09/2019  . Prediabetes 08/09/2019  . Premature menopause 08/09/2019    Past Surgical History:  Procedure Laterality Date  . LEEP      Current Medications: Current  Meds  Medication Sig  . Ascorbic Acid (VITAMIN C) 1000 MG tablet Take 1,000 mg by mouth daily.  Marland Kitchen MAGNESIUM PO Take 1 tablet by mouth daily at 6 (six) AM.  . midodrine (PROAMATINE) 2.5 MG tablet Take 1 tablet (2.5 mg total) by mouth every 8 (eight) hours as needed (systolic blood pressure less than 100).  . SELENIUM PO Take 1 tablet by mouth daily.  Marland Kitchen VITAMIN D PO Take 1 tablet by mouth daily.  Marland Kitchen VITAMIN K PO Take 1 tablet by mouth daily.  . [DISCONTINUED] midodrine (PROAMATINE) 2.5 MG tablet Take 1 tablet (2.5 mg total) by mouth every 8 (eight) hours as needed (Orthostatic hypotension).     Allergies:   Quinolones   Social History   Socioeconomic History  . Marital status: Married    Spouse name: Not on file  . Number of children: Not on file  . Years of education: Not on file  . Highest education level: Not on file  Occupational History  . Not on file  Tobacco Use  . Smoking status: Never Smoker  . Smokeless tobacco: Never Used  Substance and Sexual Activity  . Alcohol use: Not Currently  . Drug use: Never  . Sexual activity: Yes    Birth control/protection: None  Other Topics Concern  . Not on file  Social History Narrative  . Not on file   Social Determinants of Health   Financial Resource  Strain: Not on file  Food Insecurity: Not on file  Transportation Needs: Not on file  Physical Activity: Not on file  Stress: Not on file  Social Connections: Not on file     Family History: The patient's family history includes Cancer in her maternal grandmother; Diabetes in her paternal grandmother; Heart attack in her maternal grandfather and paternal grandfather; Scoliosis in her father.  ROS:   Review of Systems  Constitution: Negative for decreased appetite, fever and weight gain.  HENT: Negative for congestion, ear discharge, hoarse voice and sore throat.   Eyes: Negative for discharge, redness, vision loss in right eye and visual halos.  Cardiovascular: Negative for  chest pain, dyspnea on exertion, leg swelling, orthopnea and palpitations.  Respiratory: Negative for cough, hemoptysis, shortness of breath and snoring.   Endocrine: Negative for heat intolerance and polyphagia.  Hematologic/Lymphatic: Negative for bleeding problem. Does not bruise/bleed easily.  Skin: Negative for flushing, nail changes, rash and suspicious lesions.  Musculoskeletal: Negative for arthritis, joint pain, muscle cramps, myalgias, neck pain and stiffness.  Gastrointestinal: Negative for abdominal pain, bowel incontinence, diarrhea and excessive appetite.  Genitourinary: Negative for decreased libido, genital sores and incomplete emptying.  Neurological: Negative for brief paralysis, focal weakness, headaches and loss of balance.  Psychiatric/Behavioral: Negative for altered mental status, depression and suicidal ideas.  Allergic/Immunologic: Negative for HIV exposure and persistent infections.    EKGs/Labs/Other Studies Reviewed:    The following studies were reviewed today:   EKG:  none today  ZIO monitor The patient wore the monitor for 14 days starting Jul 05, 2019 Indication: Paroxysmal atrial fibrillation  The minimum heart rate was 50 bpm, maximum heart rate was 135 bpm, and average heart rate was 81 bpm. Predominant underlying rhythm was Sinus Rhythm.  Premature atrial complexes were rare (<1.0%). Premature Ventricular complexes were rare (<1.0%).  No ventricular tachycardia, no pauses, No AV block, no supraventricular tachycardia and no atrial fibrillation present. 10 patient triggered events with 9 diary events majority associated with premature ventricular complex.   Conclusion: The study is remarkable for rare symptomatic premature ventricular complexes.  Transthoracic echocardiogram 06/22/2019 IMPRESSIONS  1. Left ventricular ejection fraction, by estimation, is 60 to 65%. The  left ventricle has normal function. The left ventricle has no regional   wall motion abnormalities. Left ventricular diastolic parameters are  consistent with Grade I diastolic  dysfunction (impaired relaxation).  2. Right ventricular systolic function is normal. The right ventricular  size is normal. There is normal pulmonary artery systolic pressure.  3. The mitral valve is normal in structure. No evidence of mitral valve  regurgitation. No evidence of mitral stenosis.  4. The aortic valve is normal in structure. Aortic valve regurgitation is  not visualized. No aortic stenosis is present.  5. The inferior vena cava is normal in size with greater than 50%  respiratory variability, suggesting right atrial pressure of 3 mmHg.   Recent Labs: 08/08/2019: ALT 41; BUN 24; Creatinine, Ser 0.64; Potassium 4.6; Sodium 134; TSH 1.71  Recent Lipid Panel No results found for: CHOL, TRIG, HDL, CHOLHDL, VLDL, LDLCALC, LDLDIRECT  Physical Exam:    VS:  BP 100/60   Pulse 80   Ht  (1.676 m)   Wt 124 lb 9.6 oz (56.5 kg)   SpO2 98%   BMI 20.11 kg/m     Wt Readings from Last 3 Encounters:  06/05/20 124 lb 9.6 oz (56.5 kg)  05/17/20 125 lb (56.7 kg)  08/08/19 122 lb 3.2  oz (55.4 kg)     GEN: Well nourished, well developed in no acute distress HEENT: Normal NECK: No JVD; No carotid bruits LYMPHATICS: No lymphadenopathy CARDIAC: S1S2 noted,RRR, no murmurs, rubs, gallops RESPIRATORY:  Clear to auscultation without rales, wheezing or rhonchi  ABDOMEN: Soft, non-tender, non-distended, +bowel sounds, no guarding. EXTREMITIES: No edema, No cyanosis, no clubbing MUSCULOSKELETAL:  No deformity  SKIN: Warm and dry NEUROLOGIC:  Alert and oriented x 3, non-focal PSYCHIATRIC:  Normal affect, good insight  ASSESSMENT:    1. Chest pain of uncertain etiology   2. Dyspnea on exertion   3. Prediabetes   4. Palpitations   5. PVC (premature ventricular contraction)    PLAN:    She is having intermittent chest pain which sounds atypical but the patient does have  some risk factors for coronary artery disease therefore we discussed multiple different options shared decision stress echocardiogram will be ordered at this time.  Have educated patient about this testing all of her questions has been answered.  Her most recent ZIO monitor which was done in May 2021 showed rare symptomatic PVCs.  She is having increasing palpitations at the time the patient will get a Kardia mobile to monitor this to see if there is any significant arrhythmia.  I have encouraged her Lourena Simmonds mobile to make sure that she is not going into atrial fibrillation as she does have documented diagnosis of atrial fibrillation without any EKG evidence.  In terms of her low blood pressures she was orthostatic positive in the office today.  I have did recommend starting midodrine but the patient has declined and preferred nonmedicinal approaches which we discussed in the office today.  She will have midodrine 2.5 mg as needed for systolic blood pressure less than 100 especially in the setting of symptoms of dizziness and lightheadedness.  She was diagnosed with prediabetes many years ago and has not had her hemoglobin A1c reassessed we will going to have blood work today to assess her hemoglobin A1c.  The patient is in agreement with the above plan. The patient left the office in stable condition.  The patient will follow up in 3 months or sooner if needed.   Medication Adjustments/Labs and Tests Ordered: Current medicines are reviewed at length with the patient today.  Concerns regarding medicines are outlined above.  Orders Placed This Encounter  Procedures  . Hemoglobin A1c  . ECHOCARDIOGRAM STRESS TEST   Meds ordered this encounter  Medications  . DISCONTD: midodrine (PROAMATINE) 2.5 MG tablet    Sig: Take 1 tablet (2.5 mg total) by mouth every 8 (eight) hours as needed (Orthostatic hypotension).    Dispense:  10 tablet    Refill:  0  . midodrine (PROAMATINE) 2.5 MG tablet    Sig:  Take 1 tablet (2.5 mg total) by mouth every 8 (eight) hours as needed (systolic blood pressure less than 100).    Dispense:  10 tablet    Refill:  0    Patient Instructions   Medication Instructions:  Your physician has recommended you make the following change in your medication: START: Midodrine - every 8 hours as needed for systolic blood pressure less than 100 *If you need a refill on your cardiac medications before your next appointment, please call your pharmacy*   Lab Work: Your physician recommends that you return for lab work:  TODAY: HbA1C If you have labs (blood work) drawn today and your tests are completely normal, you will receive your results only by: Marland Kitchen  MyChart Message (if you have MyChart) OR . A paper copy in the mail If you have any lab test that is abnormal or we need to change your treatment, we will call you to review the results.   Testing/Procedures: Your physician has requested that you have a stress echocardiogram. For further information please visit [email or web address] Please follow instruction sheet as given.  Follow-Up: At [facility], you and your health needs are our priority.  As part of our continuing mission to provide you with exceptional heart care, we have created designated Provider Care Teams.  These Care Teams include your primary Cardiologist (physician) and Advanced Practice Providers (APPs -  Physician Assistants and Nurse Practitioners) who all work together to provide you with the care you need, when you need it.  We recommend signing up for the patient portal called "MyChart".  Sign up information is provided on this After Visit Summary.  MyChart is used to connect with patients for Virtual Visits (Telemedicine).  Patients are able to view lab/test results, encounter notes, upcoming appointments, etc.  Non-urgent messages can be sent to your provider as well.   To learn more about what you can do with MyChart, go to  [email or web address].    Your next appointment:   3 month(s)  The format for your next appointment:   In Person  Provider:   Thomasene Ripple, DO   Other Instructions  Exercise Stress Echocardiogram An exercise stress echocardiogram is a test to check how well your heart is working. This test uses sound waves and a computer to make pictures of your heart. These pictures will be taken before and after you exercise. For this test, you will walk on a treadmill or ride a bicycle to make your heart beat faster. While you exercise, your heart will be checked with an electrocardiogram (ECG). Your blood pressure will also be checked. You may have this test if:  You have chest pain or a heart problem.  You had a heart attack or heart surgery not long ago.  You have heart valve problems.  You have a condition that causes narrowing of the blood vessels that supply your heart.  You have a high risk of heart disease and: ? You are starting a new exercise program. ? You need to have a big surgery. Tell a doctor about:  Any allergies you have.  All medicines you are taking. This includes vitamins, herbs, eye drops, creams, and over-the-counter medicines.  Any problems you or family members have had with medicines that make you fall asleep (anesthetic medicines).  Any surgeries you have had.  Any blood disorders you have.  Any medical conditions you have.  Whether you are pregnant or may be pregnant. What are the risks? Generally, this is a safe test. However, problems may occur, including:  Chest pain.  Feeling dizzy or light-headed.  Shortness of breath.  Increased or irregular heartbeat.  Feeling like you may vomit (nausea) or vomiting.  Heart attack. This is very rare. What happens before the test? Medicines  Ask your doctor about changing or stopping your normal medicines. This is important if you take diabetes medicines or blood thinners.  If you use an inhaler,  bring it to the test. General instructions  Wear comfortable clothes and walking shoes.  Follow instructions from your doctor about what you cannot eat or drink before the test.  Do not drink or eat anything that has caffeine in it. Stop having caffeine 24 hours before the test.  Do not smoke or use products that contain nicotine or tobacco for 4 hours before the test. If you need help quitting, ask your doctor. What happens during the test?  You will take off your clothes from the waist up and put on a hospital gown.  Electrodes or patches will be put on your chest.  A blood pressure cuff will be put on your arm.  Before you exercise, a computer will make a picture of your heart. To do this: ? You will lie down and a gel will be put on your chest. ? A wand will be moved over the gel. ? Sound waves from the wand will go to the computer to make the picture.  Then, you will start to exercise. You may walk on a treadmill or pedal a bicycle.  Your blood pressure and heart rhythm will be checked while you exercise.  The exercise will get harder or faster.  You will exercise until: ? Your heart reaches a certain level. ? You are too tired to go on. ? You cannot go on because of chest pain, weakness, or dizziness.  You will lie down right away so another picture of your heart can be taken. The procedure may vary among doctors and hospitals.   What can I expect after the test?  After your test, it is common to have: ? Mild soreness. ? Mild tiredness. Your heart rate and blood pressure will be checked until they return to your normal levels. You should not have any new symptoms after this test. Follow these instructions at home:  If your doctor says that you can, you may: ? Eat what you normally eat. ? Do your normal activities.  Take over-the-counter and prescription medicines only as told by your doctor.  Keep all follow-up visits.  It is up to you to get the results of  your test. Ask how to get your results when they are ready. Contact a doctor if:  You feel dizzy or light-headed.  You have a fast or irregular heartbeat.  You feel like you may vomit or you vomit.  You have a headache.  You feel short of breath. Get help right away if:  You develop pain or pressure: ? In your chest. ? In your jaw or neck. ? Between your shoulders. ? That goes down your left arm.  You faint.  You have trouble breathing. These symptoms may be an emergency. Get medical help right away. Call your local emergency services (911 in the [place]  Do not wait to see if the symptoms will go away.  Do not drive yourself to the hospital. Summary  This is a test that checks how well your heart is working.  Follow instructions about what you cannot eat or drink before the test. Ask your doctor if you should take your normal medicines before the test.  Stop having caffeine 24 hours before the test.  Do not smoke or use products with nicotine or tobacco in them for 4 hours before the test.  During the test, your blood pressure and heart rhythm will be checked while you exercise. This information is not intended to replace advice given to you by your health care provider. Make sure you discuss any questions you have with your health care provider. Document Revised: 10/10/2019 Document Reviewed: 10/10/2019 Elsevier Patient Education  2021 Elsevier Inc.  [email or web address]        FDA-cleared, clinical grade mobile EKG monitor: Lourena SimmondsKardia is the most clinically-validated mobile  EKG used by the world's leading cardiac care medical professionals With Basic service, know instantly if your heart rhythm is normal or if atrial fibrillation is detected, and email the last single EKG recording to yourself or your doctor Premium service, available for purchase through the Kardia app for $9.99 per month or $99 per year, includes unlimited  history and storage of your EKG recordings, a monthly EKG summary report to share with your doctor, along with the ability to track your blood pressure, activity and weight Includes one KardiaMobile phone clip FREE SHIPPING: Standard delivery 1-3 business days. Orders placed by 11:00am PST will ship that afternoon. Otherwise, will ship next business day. All orders ship via [employer] from [place], [place]        Adopting a Healthy Lifestyle.  Know what a healthy weight is for you (roughly BMI <25) and aim to maintain this   Aim for 7+ servings of fruits and vegetables daily   65-80+ fluid ounces of water or unsweet tea for healthy kidneys   Limit to max 1 drink of alcohol per day; avoid smoking/tobacco   Limit animal fats in diet for cholesterol and heart health - choose grass fed whenever available   Avoid highly processed foods, and foods high in saturated/trans fats   Aim for low stress - take time to unwind and care for your mental health   Aim for 150 min of moderate intensity exercise weekly for heart health, and weights twice weekly for bone health   Aim for 7-9 hours of sleep daily   When it comes to diets, agreement about the perfect plan isnt easy to find, even among the experts. Experts at the [facility] [employer] developed an idea known as the Healthy Eating Plate. Just imagine a plate divided into logical, healthy portions.   The emphasis is on diet quality:   Load up on vegetables and fruits - one-half of your plate: Aim for color and variety, and remember that potatoes dont count.   Go for whole grains - one-quarter of your plate: Whole wheat, barley, wheat berries, quinoa, oats, brown rice, and foods made with them. If you want pasta, go with whole wheat pasta.   Protein power - one-quarter of your plate: Fish, chicken, beans, and nuts are all healthy, versatile protein sources. Limit red meat.   The diet, however, does go beyond the plate,  offering a few other suggestions.   Use healthy plant oils, such as olive, canola, soy, corn, sunflower and peanut. Check the labels, and avoid partially hydrogenated oil, which have unhealthy trans fats.   If youre thirsty, drink water. Coffee and tea are good in moderation, but skip sugary drinks and limit milk and dairy products to one or two daily servings.   The type of carbohydrate in the diet is more important than the amount. Some sources of carbohydrates, such as vegetables, fruits, whole grains, and beans-are healthier than others.   Finally, stay active  Signed, Thomasene Ripple, DO  06/05/2020 12:06 PM    [facility]

## 2020-06-05 NOTE — Patient Instructions (Addendum)
Medication Instructions:  Your physician has recommended you make the following change in your medication: START: Midodrine - every 8 hours as needed for systolic blood pressure less than 100 *If you need a refill on your cardiac medications before your next appointment, please call your pharmacy*   Lab Work: Your physician recommends that you return for lab work:  TODAY: HbA1C If you have labs (blood work) drawn today and your tests are completely normal, you will receive your results only by: Marland Kitchen MyChart Message (if you have MyChart) OR . A paper copy in the mail If you have any lab test that is abnormal or we need to change your treatment, we will call you to review the results.   Testing/Procedures: Your physician has requested that you have a stress echocardiogram. For further information please visit [email or web address] Please follow instruction sheet as given.  Follow-Up: At [facility], you and your health needs are our priority.  As part of our continuing mission to provide you with exceptional heart care, we have created designated Provider Care Teams.  These Care Teams include your primary Cardiologist (physician) and Advanced Practice Providers (APPs -  Physician Assistants and Nurse Practitioners) who all work together to provide you with the care you need, when you need it.  We recommend signing up for the patient portal called "MyChart".  Sign up information is provided on this After Visit Summary.  MyChart is used to connect with patients for Virtual Visits (Telemedicine).  Patients are able to view lab/test results, encounter notes, upcoming appointments, etc.  Non-urgent messages can be sent to your provider as well.   To learn more about what you can do with MyChart, go to [email or web address].    Your next appointment:   3 month(s)  The format for your next appointment:   In Person  Provider:   Thomasene Ripple, DO   Other Instructions  Exercise Stress  Echocardiogram An exercise stress echocardiogram is a test to check how well your heart is working. This test uses sound waves and a computer to make pictures of your heart. These pictures will be taken before and after you exercise. For this test, you will walk on a treadmill or ride a bicycle to make your heart beat faster. While you exercise, your heart will be checked with an electrocardiogram (ECG). Your blood pressure will also be checked. You may have this test if:  You have chest pain or a heart problem.  You had a heart attack or heart surgery not long ago.  You have heart valve problems.  You have a condition that causes narrowing of the blood vessels that supply your heart.  You have a high risk of heart disease and: ? You are starting a new exercise program. ? You need to have a big surgery. Tell a doctor about:  Any allergies you have.  All medicines you are taking. This includes vitamins, herbs, eye drops, creams, and over-the-counter medicines.  Any problems you or family members have had with medicines that make you fall asleep (anesthetic medicines).  Any surgeries you have had.  Any blood disorders you have.  Any medical conditions you have.  Whether you are pregnant or may be pregnant. What are the risks? Generally, this is a safe test. However, problems may occur, including:  Chest pain.  Feeling dizzy or light-headed.  Shortness of breath.  Increased or irregular heartbeat.  Feeling like you may vomit (nausea) or vomiting.  Heart attack. This is very rare. What  happens before the test? Medicines  Ask your doctor about changing or stopping your normal medicines. This is important if you take diabetes medicines or blood thinners.  If you use an inhaler, bring it to the test. General instructions  Wear comfortable clothes and walking shoes.  Follow instructions from your doctor about what you cannot eat or drink before the test.  Do not drink  or eat anything that has caffeine in it. Stop having caffeine 24 hours before the test.  Do not smoke or use products that contain nicotine or tobacco for 4 hours before the test. If you need help quitting, ask your doctor. What happens during the test?  You will take off your clothes from the waist up and put on a hospital gown.  Electrodes or patches will be put on your chest.  A blood pressure cuff will be put on your arm.  Before you exercise, a computer will make a picture of your heart. To do this: ? You will lie down and a gel will be put on your chest. ? A wand will be moved over the gel. ? Sound waves from the wand will go to the computer to make the picture.  Then, you will start to exercise. You may walk on a treadmill or pedal a bicycle.  Your blood pressure and heart rhythm will be checked while you exercise.  The exercise will get harder or faster.  You will exercise until: ? Your heart reaches a certain level. ? You are too tired to go on. ? You cannot go on because of chest pain, weakness, or dizziness.  You will lie down right away so another picture of your heart can be taken. The procedure may vary among doctors and hospitals.   What can I expect after the test?  After your test, it is common to have: ? Mild soreness. ? Mild tiredness. Your heart rate and blood pressure will be checked until they return to your normal levels. You should not have any new symptoms after this test. Follow these instructions at home:  If your doctor says that you can, you may: ? Eat what you normally eat. ? Do your normal activities.  Take over-the-counter and prescription medicines only as told by your doctor.  Keep all follow-up visits.  It is up to you to get the results of your test. Ask how to get your results when they are ready. Contact a doctor if:  You feel dizzy or light-headed.  You have a fast or irregular heartbeat.  You feel like you may vomit or you  vomit.  You have a headache.  You feel short of breath. Get help right away if:  You develop pain or pressure: ? In your chest. ? In your jaw or neck. ? Between your shoulders. ? That goes down your left arm.  You faint.  You have trouble breathing. These symptoms may be an emergency. Get medical help right away. Call your local emergency services (911 in the [place]  Do not wait to see if the symptoms will go away.  Do not drive yourself to the hospital. Summary  This is a test that checks how well your heart is working.  Follow instructions about what you cannot eat or drink before the test. Ask your doctor if you should take your normal medicines before the test.  Stop having caffeine 24 hours before the test.  Do not smoke or use products with nicotine or tobacco in them for  4 hours before the test.  During the test, your blood pressure and heart rhythm will be checked while you exercise. This information is not intended to replace advice given to you by your health care provider. Make sure you discuss any questions you have with your health care provider. Document Revised: 10/10/2019 Document Reviewed: 10/10/2019 Elsevier Patient Education  2021 [employer]  [place] [email or web address]        FDA-cleared, clinical grade mobile EKG monitor: Lourena Simmonds is the most clinically-validated mobile EKG used by the world's leading cardiac care medical professionals With Basic service, know instantly if your heart rhythm is normal or if atrial fibrillation is detected, and email the last single EKG recording to yourself or your doctor Premium service, available for purchase through the Kardia app for $9.99 per month or $99 per year, includes unlimited history and storage of your EKG recordings, a monthly EKG summary report to share with your doctor, along with the ability to track your blood pressure, activity and weight Includes one KardiaMobile  phone clip FREE SHIPPING: Standard delivery 1-3 business days. Orders placed by 11:00am PST will ship that afternoon. Otherwise, will ship next business day. All orders ship via [employer] from [place], [place]

## 2020-06-06 LAB — HEMOGLOBIN A1C
Est. average glucose Bld gHb Est-mCnc: 120 mg/dL
Hgb A1c MFr Bld: 5.8 % — ABNORMAL HIGH (ref 4.8–5.6)

## 2020-06-17 ENCOUNTER — Ambulatory Visit: Payer: Medicaid Other | Admitting: Obstetrics & Gynecology

## 2020-07-02 ENCOUNTER — Other Ambulatory Visit: Payer: Medicaid Other

## 2020-07-16 ENCOUNTER — Other Ambulatory Visit: Payer: Self-pay

## 2020-07-16 ENCOUNTER — Ambulatory Visit (HOSPITAL_BASED_OUTPATIENT_CLINIC_OR_DEPARTMENT_OTHER)
Admission: RE | Admit: 2020-07-16 | Discharge: 2020-07-16 | Disposition: A | Discharge status: Home / Self Care | Payer: Medicaid Other | Source: Ambulatory Visit | Attending: Obstetrics & Gynecology | Admitting: Obstetrics & Gynecology

## 2020-07-16 ENCOUNTER — Encounter (HOSPITAL_BASED_OUTPATIENT_CLINIC_OR_DEPARTMENT_OTHER): Payer: Self-pay

## 2020-07-16 DIAGNOSIS — Z1231 Encounter for screening mammogram for malignant neoplasm of breast: Secondary | ICD-10-CM

## 2020-07-31 DIAGNOSIS — R0602 Shortness of breath: Secondary | ICD-10-CM | POA: Diagnosis not present

## 2020-07-31 DIAGNOSIS — R0789 Other chest pain: Secondary | ICD-10-CM | POA: Diagnosis not present

## 2020-07-31 DIAGNOSIS — Z681 Body mass index (BMI) 19 or less, adult: Secondary | ICD-10-CM | POA: Diagnosis not present

## 2020-07-31 DIAGNOSIS — R5383 Other fatigue: Secondary | ICD-10-CM | POA: Diagnosis not present

## 2020-07-31 DIAGNOSIS — R7303 Prediabetes: Secondary | ICD-10-CM | POA: Diagnosis not present

## 2020-07-31 DIAGNOSIS — E039 Hypothyroidism, unspecified: Secondary | ICD-10-CM | POA: Diagnosis not present

## 2020-08-02 DIAGNOSIS — R0789 Other chest pain: Secondary | ICD-10-CM | POA: Diagnosis not present

## 2020-08-02 DIAGNOSIS — R5383 Other fatigue: Secondary | ICD-10-CM | POA: Diagnosis not present

## 2020-08-02 DIAGNOSIS — R7303 Prediabetes: Secondary | ICD-10-CM | POA: Diagnosis not present

## 2020-08-02 DIAGNOSIS — E782 Mixed hyperlipidemia: Secondary | ICD-10-CM | POA: Diagnosis not present

## 2020-08-06 ENCOUNTER — Other Ambulatory Visit: Payer: Self-pay

## 2020-08-06 ENCOUNTER — Ambulatory Visit: Payer: Medicaid Other | Admitting: Cardiology

## 2020-08-06 ENCOUNTER — Encounter: Payer: Self-pay | Admitting: Cardiology

## 2020-08-06 VITALS — BP 100/68 | HR 82 | Ht 66.0 in | Wt 125.0 lb

## 2020-08-06 DIAGNOSIS — R079 Chest pain, unspecified: Secondary | ICD-10-CM | POA: Diagnosis not present

## 2020-08-06 DIAGNOSIS — R7303 Prediabetes: Secondary | ICD-10-CM | POA: Diagnosis not present

## 2020-08-06 NOTE — Progress Notes (Signed)
Cardiology Office Note:    Date:  08/06/2020   ID:  Anita Rasmussen, DOB [birth year], MRN [id number]  PCP:  Lavada Mesi, MD  Cardiologist:  Thomasene Ripple, DO  Electrophysiologist:  None   Referring MD: Lavada Mesi, MD   " I have had some chest pain"  History of Present Illness:    Anita Rasmussen is a 45 y.o. female with a hx of questionable atrial fibrillation has not been any documentation of this but as noted in her chart, prediabetes, Hashimoto's disease, is here today to be evaluated for chest pain.   I saw the patient on June 05, 2020 at that time he had been experiencing dyspnea on exertion and intermittent chest discomfort.  During that visit I recommended that we pursue testing for coronary artery disease.  I ordered a stress echocardiogram but the patient was unable to get this scheduled.  In the interim she went to the urgent care center who recommended that she follow-up with me.  She is here today in the office she is telling me that she is experiencing midsternal intermittent chest pain.  She notes that at times she noticed that she feels this when she is preparing meals or sometimes she feels it in the middle of the night.  She describes this as a midsternal feeling that is intermittent and last for minutes at a time.  Sometimes she have associated shortness of breath.  She has not had any dizziness or any syncope episode.  Past Medical History:  Diagnosis Date  . Atrial fibrillation (HCC)   . Dyspnea on exertion 06/09/2019  . Fatigue 06/09/2019  . Hashimoto's disease 08/09/2019  . Palpitations 06/09/2019  . Paroxysmal atrial fibrillation (HCC) 06/09/2019  . Polydipsia 08/09/2019  . Prediabetes 08/09/2019  . Premature menopause 08/09/2019    Past Surgical History:  Procedure Laterality Date  . LEEP      Current Medications: Current Meds  Medication Sig  . Ascorbic Acid (VITAMIN C) 1000 MG tablet Take 1,000 mg by mouth daily.  Marland Kitchen MAGNESIUM PO Take 3 tablets by mouth daily  at 6 (six) AM.  . midodrine (PROAMATINE) 2.5 MG tablet Take 1 tablet (2.5 mg total) by mouth every 8 (eight) hours as needed (systolic blood pressure less than 100).  . SELENIUM PO Take 1 tablet by mouth daily.  Marland Kitchen VITAMIN D PO Take 1 tablet by mouth daily.  Marland Kitchen VITAMIN K PO Take 1 tablet by mouth daily.     Allergies:   Quinolones   Social History   Socioeconomic History  . Marital status: Married    Spouse name: Not on file  . Number of children: Not on file  . Years of education: Not on file  . Highest education level: Not on file  Occupational History  . Not on file  Tobacco Use  . Smoking status: Never Smoker  . Smokeless tobacco: Never Used  Substance and Sexual Activity  . Alcohol use: Not Currently  . Drug use: Never  . Sexual activity: Yes    Birth control/protection: None  Other Topics Concern  . Not on file  Social History Narrative  . Not on file   Social Determinants of Health   Financial Resource Strain: Not on file  Food Insecurity: Not on file  Transportation Needs: Not on file  Physical Activity: Not on file  Stress: Not on file  Social Connections: Not on file     Family History: The patient's family history includes Cancer in her  maternal grandmother; Diabetes in her paternal grandmother; Heart attack in her maternal grandfather and paternal grandfather; Scoliosis in her father.  ROS:   Review of Systems  Constitution: Negative for decreased appetite, fever and weight gain.  HENT: Negative for congestion, ear discharge, hoarse voice and sore throat.   Eyes: Negative for discharge, redness, vision loss in right eye and visual halos.  Cardiovascular: Negative for chest pain, dyspnea on exertion, leg swelling, orthopnea and palpitations.  Respiratory: Negative for cough, hemoptysis, shortness of breath and snoring.   Endocrine: Negative for heat intolerance and polyphagia.  Hematologic/Lymphatic: Negative for bleeding problem. Does not bruise/bleed  easily.  Skin: Negative for flushing, nail changes, rash and suspicious lesions.  Musculoskeletal: Negative for arthritis, joint pain, muscle cramps, myalgias, neck pain and stiffness.  Gastrointestinal: Negative for abdominal pain, bowel incontinence, diarrhea and excessive appetite.  Genitourinary: Negative for decreased libido, genital sores and incomplete emptying.  Neurological: Negative for brief paralysis, focal weakness, headaches and loss of balance.  Psychiatric/Behavioral: Negative for altered mental status, depression and suicidal ideas.  Allergic/Immunologic: Negative for HIV exposure and persistent infections.    EKGs/Labs/Other Studies Reviewed:    The following studies were reviewed today:   EKG: None today-[facility] also evidence of sinus rhythm, heart rate 95 bpm with poor precordial leads progression suggesting old anteroseptal ischemia.  Unfortunately there is not an EKG to compare as patient declined getting an EKG today in the office.  ZIO monitor The patient wore the monitor for 14 days starting Jul 05, 2019 Indication: Paroxysmal atrial fibrillation  The minimum heart rate was 50 bpm, maximum heart rate was 135 bpm, and average heart rate was 81 bpm. Predominant underlying rhythm was Sinus Rhythm.  Premature atrial complexes were rare (<1.0%). Premature Ventricular complexes were rare (<1.0%).  No ventricular tachycardia, no pauses, No AV block, no supraventricular tachycardia and no atrial fibrillation present. 10 patient triggered events with 9 diary events majority associated with premature ventricular complex.   Conclusion: The study is remarkable for rare symptomatic premature ventricular complexes.  Transthoracic echocardiogram 06/22/2019 IMPRESSIONS  1. Left ventricular ejection fraction, by estimation, is 60 to 65%. The  left ventricle has normal function. The left ventricle has no regional  wall motion  abnormalities. Left ventricular diastolic parameters are  consistent with Grade I diastolic  dysfunction (impaired relaxation).  2. Right ventricular systolic function is normal. The right ventricular  size is normal. There is normal pulmonary artery systolic pressure.  3. The mitral valve is normal in structure. No evidence of mitral valve  regurgitation. No evidence of mitral stenosis.  4. The aortic valve is normal in structure. Aortic valve regurgitation is  not visualized. No aortic stenosis is present.  5. The inferior vena cava is normal in size with greater than 50%  respiratory variability, suggesting right atrial pressure of 3 mmHg.    Recent Labs: 08/08/2019: ALT 41; BUN 24; Creatinine, Ser 0.64; Potassium 4.6; Sodium 134; TSH 1.71  Recent Lipid Panel No results found for: CHOL, TRIG, HDL, CHOLHDL, VLDL, LDLCALC, LDLDIRECT  Physical Exam:    VS:  BP 100/68   Pulse 82   Ht 5\' 6"  (1.676 m)   Wt 125 lb (56.7 kg)   SpO2 98%   BMI 20.18 kg/m     Wt Readings from Last 3 Encounters:  08/06/20 125 lb (56.7 kg)  06/05/20 124 lb 9.6 oz (56.5 kg)  05/17/20 125 lb (56.7 kg)  GEN: Well nourished, well developed in no acute distress HEENT: Normal NECK: No JVD; No carotid bruits LYMPHATICS: No lymphadenopathy CARDIAC: S1S2 noted,RRR, no murmurs, rubs, gallops RESPIRATORY:  Clear to auscultation without rales, wheezing or rhonchi  ABDOMEN: Soft, non-tender, non-distended, +bowel sounds, no guarding. EXTREMITIES: No edema, No cyanosis, no clubbing MUSCULOSKELETAL:  No deformity  SKIN: Warm and dry NEUROLOGIC:  Alert and oriented x 3, non-focal PSYCHIATRIC:  Normal affect, good insight  ASSESSMENT:    1. Chest pain of uncertain etiology   2. Prediabetes    PLAN:    She is not experiencing chest pain the office today.  Talked about needed testing unfortunately she had not had her stress echocardiogram since her last visit.  I went over various testing with the  patient ideally a coronary CTA would be the best definitive test for this patient but she has declined and would rather get the stress echocardiogram.  I did explain to the patient the benefits of both test since and also gave her information for these.  The stress echocardiogram will be ordered.  Prediabetes-globin A1c 6.0 on recent blood work on June 1 at [facility] she is managing this with diet and needs to speak with her PCP as well.  She had blood work which was done at [facility] which were unremarkable  The patient is in agreement with the above plan. The patient left the office in stable condition.  The patient will follow up in 6 months or sooner if needed.   Medication Adjustments/Labs and Tests Ordered: Current medicines are reviewed at length with the patient today.  Concerns regarding medicines are outlined above.  Orders Placed This Encounter  Procedures  . ECHOCARDIOGRAM STRESS TEST   No orders of the defined types were placed in this encounter.   Patient Instructions   Medication Instructions:  Your physician recommends that you continue on your current medications as directed. Please refer to the Current Medication list given to you today.  *If you need a refill on your cardiac medications before your next appointment, please call your pharmacy*   Lab Work: None If you have labs (blood work) drawn today and your tests are completely normal, you will receive your results only by: Marland Kitchen MyChart Message (if you have MyChart) OR . A paper copy in the mail If you have any lab test that is abnormal or we need to change your treatment, we will call you to review the results.   Testing/Procedures: Your physician has requested that you have a stress echocardiogram. For further information please visit [email or web address] Please follow instruction sheet as given.    [facility] Cardiovascular [facility] at [facility] [address] [place], [place] [address] Phone: [phone]    Please arrive 15 minutes prior to your appointment time for registration and insurance purposes.  The test will take approximately 3 to 4 hours to complete; you may bring reading material.  If someone comes with you to your appointment, they will need to remain in the main lobby due to limited space in the testing area. **If you are pregnant or breastfeeding, please notify the nuclear lab prior to your appointment**  How to prepare for your Myocardial Perfusion Test: . Do not eat or drink 3 hours prior to your test, except you may have water. . Do not consume products containing caffeine (regular or decaffeinated) 12 hours prior to your test. (ex: coffee, chocolate, sodas, tea). . Do bring a list of your current  medications with you.  If not listed below, you may take your medications as normal. . Do wear comfortable clothes (no dresses or overalls) and walking shoes, tennis shoes preferred (No heels or open toe shoes are allowed). . Do NOT wear cologne, perfume, aftershave, or lotions (deodorant is allowed). . If these instructions are not followed, your test will have to be rescheduled.  Please report to [address] for your test.  If you have questions or concerns about your appointment, you can call the Nuclear Lab at [phone].  If you cannot keep your appointment, please provide 24 hours notification to the Nuclear Lab, to avoid a possible $50 charge to your account.    Follow-Up: At [facility], you and your health needs are our priority.  As part of our continuing mission to provide you with exceptional heart care, we have created designated Provider Care Teams.  These Care Teams include your primary Cardiologist (physician) and Advanced Practice Providers (APPs -  Physician Assistants and Nurse Practitioners) who all work together to provide you with the care you need, when you need it.  We recommend signing  up for the patient portal called "MyChart".  Sign up information is provided on this After Visit Summary.  MyChart is used to connect with patients for Virtual Visits (Telemedicine).  Patients are able to view lab/test results, encounter notes, upcoming appointments, etc.  Non-urgent messages can be sent to your provider as well.   To learn more about what you can do with MyChart, go to [email or web address].    Your next appointment:   6 month(s)  The format for your next appointment:   In Person  Provider:   Thomasene Ripple, MD Cardiac CT Angiogram A cardiac CT angiogram is a procedure to look at the heart and the area around the heart. It may be done to help find the cause of chest pains or other symptoms of heart disease. During this procedure, a substance called contrast dye is injected into the blood vessels in the area to be checked. A large X-ray machine, called a CT scanner, then takes detailed pictures of the heart and the surrounding area. The procedure is also sometimes called a coronary CT angiogram, coronary artery scanning, or CTA. A cardiac CT angiogram allows the health care provider to see how well blood is flowing to and from the heart. The health care provider will be able to see if there are any problems, such as:  Blockage or narrowing of the coronary arteries in the heart.  Fluid around the heart.  Signs of weakness or disease in the muscles, valves, and tissues of the heart. Tell a health care provider about:  Any allergies you have. This is especially important if you have had a previous allergic reaction to contrast dye.  All medicines you are taking, including vitamins, herbs, eye drops, creams, and over-the-counter medicines.  Any blood disorders you have.  Any surgeries you have had.  Any medical conditions you have.  Whether you are pregnant or may be pregnant.  Any anxiety disorders, chronic pain, or other conditions you have that may increase your  stress or prevent you from lying still. What are the risks? Generally, this is a safe procedure. However, problems may occur, including:  Bleeding.  Infection.  Allergic reactions to medicines or dyes.  Damage to other structures or organs.  Kidney damage from the contrast dye that is used.  Increased risk of cancer from radiation exposure. This risk  is low. Talk with your health care provider about: ? The risks and benefits of testing. ? How you can receive the lowest dose of radiation. What happens before the procedure?  Wear comfortable clothing and remove any jewelry, glasses, dentures, and hearing aids.  Follow instructions from your health care provider about eating and drinking. This may include: ? For 12 hours before the procedure -- avoid caffeine. This includes tea, coffee, soda, energy drinks, and diet pills. Drink plenty of water or other fluids that do not have caffeine in them. Being well hydrated can prevent complications. ? For 4-6 hours before the procedure -- stop eating and drinking. The contrast dye can cause nausea, but this is less likely if your stomach is empty.  Ask your health care provider about changing or stopping your regular medicines. This is especially important if you are taking diabetes medicines, blood thinners, or medicines to treat problems with erections (erectile dysfunction). What happens during the procedure?  Hair on your chest may need to be removed so that small sticky patches called electrodes can be placed on your chest. These will transmit information that helps to monitor your heart during the procedure.  An IV will be inserted into one of your veins.  You might be given a medicine to control your heart rate during the procedure. This will help to ensure that good images are obtained.  You will be asked to lie on an exam table. This table will slide in and out of the CT machine during the procedure.  Contrast dye will be injected  into the IV. You might feel warm, or you may get a metallic taste in your mouth.  You will be given a medicine called nitroglycerin. This will relax or dilate the arteries in your heart.  The table that you are lying on will move into the CT machine tunnel for the scan.  The person running the machine will give you instructions while the scans are being done. You may be asked to: ? Keep your arms above your head. ? Hold your breath. ? Stay very still, even if the table is moving.  When the scanning is complete, you will be moved out of the machine.  The IV will be removed. The procedure may vary among health care providers and hospitals.   What can I expect after the procedure? After your procedure, it is common to have:  A metallic taste in your mouth from the contrast dye.  A feeling of warmth.  A headache from the nitroglycerin. Follow these instructions at home:  Take over-the-counter and prescription medicines only as told by your health care provider.  If you are told, drink enough fluid to keep your urine pale yellow. This will help to flush the contrast dye out of your body.  Most people can return to their normal activities right after the procedure. Ask your health care provider what activities are safe for you.  It is up to you to get the results of your procedure. Ask your health care provider, or the department that is doing the procedure, when your results will be ready.  Keep all follow-up visits as told by your health care provider. This is important. Contact a health care provider if:  You have any symptoms of allergy to the contrast dye. These include: ? Shortness of breath. ? Rash or hives. ? A racing heartbeat. Summary  A cardiac CT angiogram is a procedure to look at the heart and the area around  the heart. It may be done to help find the cause of chest pains or other symptoms of heart disease.  During this procedure, a large X-ray machine, called a CT  scanner, takes detailed pictures of the heart and the surrounding area after a contrast dye has been injected into blood vessels in the area.  Ask your health care provider about changing or stopping your regular medicines before the procedure. This is especially important if you are taking diabetes medicines, blood thinners, or medicines to treat erectile dysfunction.  If you are told, drink enough fluid to keep your urine pale yellow. This will help to flush the contrast dye out of your body. This information is not intended to replace advice given to you by your health care provider. Make sure you discuss any questions you have with your health care provider. Document Revised: 10/12/2018 Document Reviewed: 10/12/2018 Elsevier Patient Education  2021 Elsevier Inc.    Other Instructions      Adopting a Healthy Lifestyle.  Know what a healthy weight is for you (roughly BMI <25) and aim to maintain this   Aim for 7+ servings of fruits and vegetables daily   65-80+ fluid ounces of water or unsweet tea for healthy kidneys   Limit to max 1 drink of alcohol per day; avoid smoking/tobacco   Limit animal fats in diet for cholesterol and heart health - choose grass fed whenever available   Avoid highly processed foods, and foods high in saturated/trans fats   Aim for low stress - take time to unwind and care for your mental health   Aim for 150 min of moderate intensity exercise weekly for heart health, and weights twice weekly for bone health   Aim for 7-9 hours of sleep daily   When it comes to diets, agreement about the perfect plan isnt easy to find, even among the experts. Experts at the [facility] [employer] developed an idea known as the Healthy Eating Plate. Just imagine a plate divided into logical, healthy portions.   The emphasis is on diet quality:   Load up on vegetables and fruits - one-half of your plate: Aim for color and variety, and remember that  potatoes dont count.   Go for whole grains - one-quarter of your plate: Whole wheat, barley, wheat berries, quinoa, oats, brown rice, and foods made with them. If you want pasta, go with whole wheat pasta.   Protein power - one-quarter of your plate: Fish, chicken, beans, and nuts are all healthy, versatile protein sources. Limit red meat.   The diet, however, does go beyond the plate, offering a few other suggestions.   Use healthy plant oils, such as olive, canola, soy, corn, sunflower and peanut. Check the labels, and avoid partially hydrogenated oil, which have unhealthy trans fats.   If youre thirsty, drink water. Coffee and tea are good in moderation, but skip sugary drinks and limit milk and dairy products to one or two daily servings.   The type of carbohydrate in the diet is more important than the amount. Some sources of carbohydrates, such as vegetables, fruits, whole grains, and beans-are healthier than others.   Finally, stay active  Signed, Thomasene Ripple, DO  08/06/2020 5:12 PM    [facility]

## 2020-08-06 NOTE — Patient Instructions (Addendum)
Medication Instructions:  Your physician recommends that you continue on your current medications as directed. Please refer to the Current Medication list given to you today.  *If you need a refill on your cardiac medications before your next appointment, please call your pharmacy*   Lab Work: None If you have labs (blood work) drawn today and your tests are completely normal, you will receive your results only by: Marland Kitchen MyChart Message (if you have MyChart) OR . A paper copy in the mail If you have any lab test that is abnormal or we need to change your treatment, we will call you to review the results.   Testing/Procedures: Your physician has requested that you have a stress echocardiogram. For further information please visit [email or web address] Please follow instruction sheet as given.    [facility] at [facility] [address], [address] [place], [place] [address] Phone: [phone]    Please arrive 15 minutes prior to your appointment time for registration and insurance purposes.  The test will take approximately 3 to 4 hours to complete; you may bring reading material.  If someone comes with you to your appointment, they will need to remain in the main lobby due to limited space in the testing area. **If you are pregnant or breastfeeding, please notify the nuclear lab prior to your appointment**  How to prepare for your Myocardial Perfusion Test: . Do not eat or drink 3 hours prior to your test, except you may have water. . Do not consume products containing caffeine (regular or decaffeinated) 12 hours prior to your test. (ex: coffee, chocolate, sodas, tea). . Do bring a list of your current medications with you.  If not listed below, you may take your medications as normal. . Do wear comfortable clothes (no dresses or overalls) and walking shoes, tennis shoes preferred (No heels or open toe shoes are allowed). . Do NOT wear cologne, perfume,  aftershave, or lotions (deodorant is allowed). . If these instructions are not followed, your test will have to be rescheduled.  Please report to [address] for your test.  If you have questions or concerns about your appointment, you can call the Nuclear Lab at [phone].  If you cannot keep your appointment, please provide 24 hours notification to the Nuclear Lab, to avoid a possible $50 charge to your account.    Follow-Up: At [facility], you and your health needs are our priority.  As part of our continuing mission to provide you with exceptional heart care, we have created designated Provider Care Teams.  These Care Teams include your primary Cardiologist (physician) and Advanced Practice Providers (APPs -  Physician Assistants and Nurse Practitioners) who all work together to provide you with the care you need, when you need it.  We recommend signing up for the patient portal called "MyChart".  Sign up information is provided on this After Visit Summary.  MyChart is used to connect with patients for Virtual Visits (Telemedicine).  Patients are able to view lab/test results, encounter notes, upcoming appointments, etc.  Non-urgent messages can be sent to your provider as well.   To learn more about what you can do with MyChart, go to [email or web address].    Your next appointment:   6 month(s)  The format for your next appointment:   In Person  Provider:   Thomasene Ripple, MD Cardiac CT Angiogram A cardiac CT angiogram is a procedure to look at the heart and the area around the  heart. It may be done to help find the cause of chest pains or other symptoms of heart disease. During this procedure, a substance called contrast dye is injected into the blood vessels in the area to be checked. A large X-ray machine, called a CT scanner, then takes detailed pictures of the heart and the surrounding area. The procedure is also sometimes called a coronary CT angiogram,  coronary artery scanning, or CTA. A cardiac CT angiogram allows the health care provider to see how well blood is flowing to and from the heart. The health care provider will be able to see if there are any problems, such as:  Blockage or narrowing of the coronary arteries in the heart.  Fluid around the heart.  Signs of weakness or disease in the muscles, valves, and tissues of the heart. Tell a health care provider about:  Any allergies you have. This is especially important if you have had a previous allergic reaction to contrast dye.  All medicines you are taking, including vitamins, herbs, eye drops, creams, and over-the-counter medicines.  Any blood disorders you have.  Any surgeries you have had.  Any medical conditions you have.  Whether you are pregnant or may be pregnant.  Any anxiety disorders, chronic pain, or other conditions you have that may increase your stress or prevent you from lying still. What are the risks? Generally, this is a safe procedure. However, problems may occur, including:  Bleeding.  Infection.  Allergic reactions to medicines or dyes.  Damage to other structures or organs.  Kidney damage from the contrast dye that is used.  Increased risk of cancer from radiation exposure. This risk is low. Talk with your health care provider about: ? The risks and benefits of testing. ? How you can receive the lowest dose of radiation. What happens before the procedure?  Wear comfortable clothing and remove any jewelry, glasses, dentures, and hearing aids.  Follow instructions from your health care provider about eating and drinking. This may include: ? For 12 hours before the procedure -- avoid caffeine. This includes tea, coffee, soda, energy drinks, and diet pills. Drink plenty of water or other fluids that do not have caffeine in them. Being well hydrated can prevent complications. ? For 4-6 hours before the procedure -- stop eating and drinking. The  contrast dye can cause nausea, but this is less likely if your stomach is empty.  Ask your health care provider about changing or stopping your regular medicines. This is especially important if you are taking diabetes medicines, blood thinners, or medicines to treat problems with erections (erectile dysfunction). What happens during the procedure?  Hair on your chest may need to be removed so that small sticky patches called electrodes can be placed on your chest. These will transmit information that helps to monitor your heart during the procedure.  An IV will be inserted into one of your veins.  You might be given a medicine to control your heart rate during the procedure. This will help to ensure that good images are obtained.  You will be asked to lie on an exam table. This table will slide in and out of the CT machine during the procedure.  Contrast dye will be injected into the IV. You might feel warm, or you may get a metallic taste in your mouth.  You will be given a medicine called nitroglycerin. This will relax or dilate the arteries in your heart.  The table that you are lying on will  move into the CT machine tunnel for the scan.  The person running the machine will give you instructions while the scans are being done. You may be asked to: ? Keep your arms above your head. ? Hold your breath. ? Stay very still, even if the table is moving.  When the scanning is complete, you will be moved out of the machine.  The IV will be removed. The procedure may vary among health care providers and hospitals.   What can I expect after the procedure? After your procedure, it is common to have:  A metallic taste in your mouth from the contrast dye.  A feeling of warmth.  A headache from the nitroglycerin. Follow these instructions at home:  Take over-the-counter and prescription medicines only as told by your health care provider.  If you are told, drink enough fluid to keep  your urine pale yellow. This will help to flush the contrast dye out of your body.  Most people can return to their normal activities right after the procedure. Ask your health care provider what activities are safe for you.  It is up to you to get the results of your procedure. Ask your health care provider, or the department that is doing the procedure, when your results will be ready.  Keep all follow-up visits as told by your health care provider. This is important. Contact a health care provider if:  You have any symptoms of allergy to the contrast dye. These include: ? Shortness of breath. ? Rash or hives. ? A racing heartbeat. Summary  A cardiac CT angiogram is a procedure to look at the heart and the area around the heart. It may be done to help find the cause of chest pains or other symptoms of heart disease.  During this procedure, a large X-ray machine, called a CT scanner, takes detailed pictures of the heart and the surrounding area after a contrast dye has been injected into blood vessels in the area.  Ask your health care provider about changing or stopping your regular medicines before the procedure. This is especially important if you are taking diabetes medicines, blood thinners, or medicines to treat erectile dysfunction.  If you are told, drink enough fluid to keep your urine pale yellow. This will help to flush the contrast dye out of your body. This information is not intended to replace advice given to you by your health care provider. Make sure you discuss any questions you have with your health care provider. Document Revised: 10/12/2018 Document Reviewed: 10/12/2018 Elsevier Patient Education  2021 [employer].    Other Instructions

## 2020-08-06 NOTE — Addendum Note (Signed)
Addended by: Thomasene Ripple on: 08/06/2020 05:13 PM   Modules accepted: Orders

## 2020-08-09 ENCOUNTER — Telehealth: Payer: Self-pay | Admitting: Cardiology

## 2020-08-09 NOTE — Telephone Encounter (Signed)
Pt states that after researching test she would like to have a MRI as they do not all require contrast. Hod do you advise?

## 2020-08-09 NOTE — Telephone Encounter (Signed)
Patient discussed alternative testing with Dr. Servando Salina at her last appointment. She decided she would like to get an MRI done. She would like to know what she needs to do on her end. Please assist

## 2020-08-09 NOTE — Telephone Encounter (Signed)
PT is callback with additional information to give to the nurse

## 2020-08-09 NOTE — Telephone Encounter (Signed)
Pt called back and states that she called the imaging department and was told they can see the arteries and valves without contrast. I advised the patient that I would send a message to Dr. Servando Salina.

## 2020-08-09 NOTE — Telephone Encounter (Signed)
Pt is requesting a Provider Switch from Dr. Lavona Mound Tobb to Dr. Charlton Haws

## 2020-08-14 ENCOUNTER — Encounter: Payer: Self-pay | Admitting: Family Medicine

## 2020-08-14 ENCOUNTER — Ambulatory Visit: Payer: Medicaid Other | Admitting: Family Medicine

## 2020-08-14 ENCOUNTER — Other Ambulatory Visit: Payer: Self-pay

## 2020-08-14 VITALS — BP 93/59 | HR 81 | Ht 66.0 in | Wt 125.0 lb

## 2020-08-14 DIAGNOSIS — E063 Autoimmune thyroiditis: Secondary | ICD-10-CM

## 2020-08-14 DIAGNOSIS — R739 Hyperglycemia, unspecified: Secondary | ICD-10-CM

## 2020-08-14 DIAGNOSIS — Z7689 Persons encountering health services in other specified circumstances: Secondary | ICD-10-CM | POA: Diagnosis not present

## 2020-08-14 DIAGNOSIS — F419 Anxiety disorder, unspecified: Secondary | ICD-10-CM | POA: Diagnosis not present

## 2020-08-14 DIAGNOSIS — R079 Chest pain, unspecified: Secondary | ICD-10-CM | POA: Diagnosis not present

## 2020-08-14 DIAGNOSIS — R202 Paresthesia of skin: Secondary | ICD-10-CM | POA: Diagnosis not present

## 2020-08-14 DIAGNOSIS — R350 Frequency of micturition: Secondary | ICD-10-CM

## 2020-08-14 DIAGNOSIS — R002 Palpitations: Secondary | ICD-10-CM

## 2020-08-14 NOTE — Progress Notes (Addendum)
Office Visit Note   Patient: Anita Rasmussen           Date of Birth: 1975-05-15           MRN: [id number] Visit Date: 08/14/2020 Requested by: Jonnie Kind, MD [address] [place],  [place] [address] PCP: Lavada Mesi, MD  Subjective: Chief Complaint  Patient presents with   Other    Establish primary care    HPI: She is here to establish care.  She found my name through a search for functional medicine.  About 10 years ago she took a dose of Levaquin.  It put her in the hospital with palpitations and paresthesias.  She has not felt well since then.  She has been evaluated and treated at [facility] for about 5 years.  She was told she had leaky gut syndrome and has made modifications to her diet.  She has not noticed any improvement in her symptoms.  Recently the palpitations seem to be getting worse and she had some chest pain prompting a visit to the urgent care.  EKG was normal and labs were unrevealing for a cause of the palpitations.  She has a history of Hashimoto's thyroiditis but has not required prescription medication for this.  Her T3 level is typically low.  She has blood sugars in prediabetes range and if she does not eat consistently, she develops hypoglycemia fairly severely.  Family history is unremarkable.  Her mother is with her today.                ROS:   All other systems were reviewed and are negative.  Objective: Vital Signs: BP (!) 93/59 (BP Location: Left Arm, Patient Position: Sitting, Cuff Size: Normal)   Pulse 81   Ht 5\' 6"  (1.676 m)   Wt 125 lb (56.7 kg)   BMI 20.18 kg/m   Physical Exam:  General:  Alert and oriented, in no acute distress. Pulm:  Breathing unlabored. Psy:  Normal mood, congruent affect. Skin: No rash or lesions. HEENT:  [place]/AT, PERRLA, EOM Full, no nystagmus.  Funduscopic examination within normal limits.  No conjunctival erythema.  Tympanic membranes are pearly gray with normal  landmarks.  External ear canals are normal.  Nasal passages are clear.  Oropharynx is clear.  No significant lymphadenopathy.  No thyromegaly or nodules.  2+ carotid pulses without bruits.  She has two fillings. CV: Regular rate and rhythm without murmurs, rubs, or gallops.  No peripheral edema.  2+ radial and posterior tibial pulses. Lungs: Clear to auscultation throughout with no wheezing or areas of consolidation. Abd: Bowel sounds are active, no hepatosplenomegaly or masses.  Soft and nontender.  No audible bruits.  No evidence of ascites. Extremities: She has white spots on 2 of her fingernails with some longitudinal ridging.    Imaging: No results found.  Assessment & Plan: Chronic palpitations, paresthesias, leaky gut syndrome and anxiety -We will draw some labs to further evaluate.  Ultimately I think we need to work on gut healing before everything else will start improving. -Regarding palpitations, patient requests a cardiac MRI scan without contrast rather than stress echocardiogram with contrast.  I will order that for her.  She is very concerned about having a reaction to contrast, which is completely understandable. - Consider transitioning to a low carb diet eventually, possibly with intermittent fasting.    Procedures: No procedures performed        PMFS History: Patient Active Problem  List   Diagnosis Date Noted   Chest pain of uncertain etiology 06/05/2020   PVC (premature ventricular contraction) 06/05/2020   Atrial fibrillation (HCC)    Prediabetes 08/09/2019   Premature menopause 08/09/2019   Hashimoto's disease 08/09/2019   Polydipsia 08/09/2019   Palpitations 06/09/2019   Dyspnea on exertion 06/09/2019   Fatigue 06/09/2019   Paroxysmal atrial fibrillation (HCC) 06/09/2019   Past Medical History:  Diagnosis Date   Atrial fibrillation (HCC)    Dyspnea on exertion 06/09/2019   Fatigue 06/09/2019   Hashimoto's disease 08/09/2019   Palpitations 06/09/2019    Paroxysmal atrial fibrillation (HCC) 06/09/2019   Polydipsia 08/09/2019   Prediabetes 08/09/2019   Premature menopause 08/09/2019    Family History  Problem Relation Age of Onset   Cancer Maternal Grandmother        abdomen   Heart attack Maternal Grandfather    Diabetes Paternal Grandmother    Heart attack Paternal Grandfather    Scoliosis Father     Past Surgical History:  Procedure Laterality Date   LEEP     Social History   Occupational History   Not on file  Tobacco Use   Smoking status: Never   Smokeless tobacco: Never  Substance and Sexual Activity   Alcohol use: Not Currently   Drug use: Never   Sexual activity: Yes    Birth control/protection: None

## 2020-08-15 LAB — URINALYSIS, ROUTINE W REFLEX MICROSCOPIC
Bilirubin Urine: NEGATIVE
Glucose, UA: NEGATIVE
Hgb urine dipstick: NEGATIVE
Ketones, ur: NEGATIVE
Leukocytes,Ua: NEGATIVE
Nitrite: NEGATIVE
Protein, ur: NEGATIVE
Specific Gravity, Urine: 1.007 (ref 1.001–1.035)
pH: 6.5 (ref 5.0–8.0)

## 2020-08-15 LAB — URINE CULTURE
MICRO NUMBER:: 12011301
Result:: NO GROWTH
SPECIMEN QUALITY:: ADEQUATE

## 2020-08-16 ENCOUNTER — Telehealth: Payer: Self-pay | Admitting: Family Medicine

## 2020-08-16 ENCOUNTER — Ambulatory Visit: Payer: Medicaid Other | Admitting: Family Medicine

## 2020-08-16 NOTE — Telephone Encounter (Signed)
Urine looks normal

## 2020-08-20 DIAGNOSIS — R739 Hyperglycemia, unspecified: Secondary | ICD-10-CM | POA: Diagnosis not present

## 2020-08-20 DIAGNOSIS — R079 Chest pain, unspecified: Secondary | ICD-10-CM | POA: Diagnosis not present

## 2020-08-20 DIAGNOSIS — R002 Palpitations: Secondary | ICD-10-CM | POA: Diagnosis not present

## 2020-08-20 DIAGNOSIS — R202 Paresthesia of skin: Secondary | ICD-10-CM | POA: Diagnosis not present

## 2020-08-20 DIAGNOSIS — E063 Autoimmune thyroiditis: Secondary | ICD-10-CM | POA: Diagnosis not present

## 2020-08-20 DIAGNOSIS — F419 Anxiety disorder, unspecified: Secondary | ICD-10-CM | POA: Diagnosis not present

## 2020-08-23 ENCOUNTER — Telehealth: Payer: Self-pay | Admitting: Family Medicine

## 2020-08-23 LAB — COMPREHENSIVE METABOLIC PANEL
AG Ratio: 2 (calc) (ref 1.0–2.5)
ALT: 45 U/L — ABNORMAL HIGH (ref 6–29)
AST: 25 U/L (ref 10–35)
Albumin: 4.4 g/dL (ref 3.6–5.1)
Alkaline phosphatase (APISO): 71 U/L (ref 31–125)
BUN: 23 mg/dL (ref 7–25)
CO2: 29 mmol/L (ref 20–32)
Calcium: 10 mg/dL (ref 8.6–10.2)
Chloride: 104 mmol/L (ref 98–110)
Creat: 0.7 mg/dL (ref 0.50–1.10)
Globulin: 2.2 g/dL (calc) (ref 1.9–3.7)
Glucose, Bld: 91 mg/dL (ref 65–139)
Potassium: 5.1 mmol/L (ref 3.5–5.3)
Sodium: 140 mmol/L (ref 135–146)
Total Bilirubin: 0.6 mg/dL (ref 0.2–1.2)
Total Protein: 6.6 g/dL (ref 6.1–8.1)

## 2020-08-23 LAB — CBC WITH DIFFERENTIAL/PLATELET
Absolute Monocytes: 553 cells/uL (ref 200–950)
Basophils Absolute: 70 cells/uL (ref 0–200)
Basophils Relative: 1 %
Eosinophils Absolute: 84 cells/uL (ref 15–500)
Eosinophils Relative: 1.2 %
HCT: 44.4 % (ref 35.0–45.0)
Hemoglobin: 14.4 g/dL (ref 11.7–15.5)
Lymphs Abs: 2478 cells/uL (ref 850–3900)
MCH: 30.9 pg (ref 27.0–33.0)
MCHC: 32.4 g/dL (ref 32.0–36.0)
MCV: 95.3 fL (ref 80.0–100.0)
MPV: 10.8 fL (ref 7.5–12.5)
Monocytes Relative: 7.9 %
Neutro Abs: 3815 cells/uL (ref 1500–7800)
Neutrophils Relative %: 54.5 %
Platelets: 280 10*3/uL (ref 140–400)
RBC: 4.66 10*6/uL (ref 3.80–5.10)
RDW: 12.6 % (ref 11.0–15.0)
Total Lymphocyte: 35.4 %
WBC: 7 10*3/uL (ref 3.8–10.8)

## 2020-08-23 LAB — COPPER, SERUM: Copper: 130 ug/dL (ref 70–175)

## 2020-08-23 LAB — THYROID PEROXIDASE ANTIBODY: Thyroperoxidase Ab SerPl-aCnc: 89 IU/mL — ABNORMAL HIGH (ref <9)

## 2020-08-23 LAB — ZINC: Zinc: 76 ug/dL (ref 60–130)

## 2020-08-23 LAB — ANA: Anti Nuclear Antibody (ANA): NEGATIVE

## 2020-08-23 LAB — THYROID PANEL WITH TSH
Free Thyroxine Index: 2 (ref 1.4–3.8)
T3 Uptake: 32 % (ref 22–35)
T4, Total: 6.2 ug/dL (ref 5.1–11.9)
TSH: 1.5 mIU/L

## 2020-08-23 LAB — VITAMIN D 25 HYDROXY (VIT D DEFICIENCY, FRACTURES): Vit D, 25-Hydroxy: 58 ng/mL (ref 30–100)

## 2020-08-23 LAB — HEMOGLOBIN A1C
Hgb A1c MFr Bld: 5.6 % of total Hgb (ref <5.7)
Mean Plasma Glucose: 114 mg/dL
eAG (mmol/L): 6.3 mmol/L

## 2020-08-23 LAB — SEDIMENTATION RATE: Sed Rate: 2 mm/h (ref 0–20)

## 2020-08-23 LAB — VITAMIN B12: Vitamin B-12: 901 pg/mL (ref 200–1100)

## 2020-08-23 LAB — C-REACTIVE PROTEIN: CRP: 1.2 mg/L (ref <8.0)

## 2020-08-23 NOTE — Telephone Encounter (Signed)
Labs show:  Copper is 130 and zinc 76, giving a ratio of 1.71 (normal ratio is about 1.0-1.2).  Copper/zinc imbalance can be associated with a lot of different issues.  Thyroid antibodies are moderately elevated, but overall thyroid function looks good.  Important to remain gluten and dairy-free.  All else looks good.

## 2020-08-29 ENCOUNTER — Ambulatory Visit: Payer: Medicaid Other | Admitting: Cardiology

## 2020-09-03 ENCOUNTER — Encounter: Payer: Self-pay | Admitting: Family Medicine

## 2020-09-11 ENCOUNTER — Telehealth (HOSPITAL_COMMUNITY): Payer: Self-pay | Admitting: *Deleted

## 2020-09-11 NOTE — Telephone Encounter (Signed)
Left message on voicemail with detailed instructions per Stress Test Requisition Sheet for test on 09/18/20 at 1405.Patient Notified to arrive 30 minutes early, and that it is imperative to arrive on time for appointment to keep from having the test rescheduled.  Patient verbalized understanding. Tymber Stallings, Adelene Idler

## 2020-09-18 ENCOUNTER — Other Ambulatory Visit (HOSPITAL_COMMUNITY): Payer: Medicaid Other

## 2020-10-31 DIAGNOSIS — R002 Palpitations: Secondary | ICD-10-CM | POA: Diagnosis not present

## 2020-10-31 DIAGNOSIS — R079 Chest pain, unspecified: Secondary | ICD-10-CM | POA: Diagnosis not present

## 2020-10-31 DIAGNOSIS — I959 Hypotension, unspecified: Secondary | ICD-10-CM | POA: Diagnosis not present

## 2020-11-14 ENCOUNTER — Telehealth (HOSPITAL_COMMUNITY): Payer: Self-pay | Admitting: *Deleted

## 2020-11-14 NOTE — Telephone Encounter (Signed)
Left message on voicemail per DPR in reference to upcoming appointment scheduled on 11/18/20 at 2:00 with detailed instructions given per Stress Test Requisition Sheet for the test. LM to arrive 30 minutes early, and that it is imperative to arrive on time for appointment to keep from having the test rescheduled. If you need to cancel or reschedule your appointment, please call the office within 24 hours of your appointment. Failure to do so may result in a cancellation of your appointment, and a $50 no show fee. Phone number given for call back for any questions. Daneil Dolin

## 2020-11-15 ENCOUNTER — Telehealth (HOSPITAL_COMMUNITY): Payer: Self-pay | Admitting: Cardiology

## 2020-11-15 NOTE — Telephone Encounter (Signed)
Patient cancelled her STRESS ECHO x 2. Please see reson below:  PATIENT CANCELLED X 2  11/15/2020 11:21 AM WN:IOEVO, TONYA M  Cancel Rsn: Patient (patient had to go OOT for family emergency. will c/b at later time to reschedule. pt was advised that appointments are booking out at appx 1 month)  Order will be removed from active Echo WQ and when patient calls back to reschedule we will reinstate the order or create a new one. Thank you.

## 2020-11-18 ENCOUNTER — Other Ambulatory Visit (HOSPITAL_COMMUNITY): Payer: Medicaid Other

## 2021-05-20 DIAGNOSIS — R079 Chest pain, unspecified: Secondary | ICD-10-CM | POA: Diagnosis not present

## 2021-05-20 DIAGNOSIS — I959 Hypotension, unspecified: Secondary | ICD-10-CM | POA: Diagnosis not present

## 2021-05-20 DIAGNOSIS — R002 Palpitations: Secondary | ICD-10-CM | POA: Diagnosis not present

## 2022-02-05 DIAGNOSIS — R079 Chest pain, unspecified: Secondary | ICD-10-CM | POA: Diagnosis not present

## 2022-02-05 DIAGNOSIS — R002 Palpitations: Secondary | ICD-10-CM | POA: Diagnosis not present
